# Patient Record
Sex: Female | Born: 1985 | Race: Black or African American | Hispanic: No | Marital: Married | Smoking: Never smoker
Health system: Southern US, Community
[De-identification: ages and names within clinical notes are randomized; demographics above are authoritative.]

## PROBLEM LIST (undated history)

## (undated) DIAGNOSIS — M1990 Unspecified osteoarthritis, unspecified site: Secondary | ICD-10-CM

## (undated) DIAGNOSIS — K219 Gastro-esophageal reflux disease without esophagitis: Secondary | ICD-10-CM

## (undated) DIAGNOSIS — L989 Disorder of the skin and subcutaneous tissue, unspecified: Secondary | ICD-10-CM

## (undated) DIAGNOSIS — D573 Sickle-cell trait: Secondary | ICD-10-CM

## (undated) DIAGNOSIS — R7303 Prediabetes: Secondary | ICD-10-CM

## (undated) DIAGNOSIS — D649 Anemia, unspecified: Secondary | ICD-10-CM

## (undated) DIAGNOSIS — O24419 Gestational diabetes mellitus in pregnancy, unspecified control: Secondary | ICD-10-CM

## (undated) DIAGNOSIS — I10 Essential (primary) hypertension: Secondary | ICD-10-CM

## (undated) DIAGNOSIS — O10013 Pre-existing essential hypertension complicating pregnancy, third trimester: Secondary | ICD-10-CM

## (undated) DIAGNOSIS — Z87442 Personal history of urinary calculi: Secondary | ICD-10-CM

## (undated) HISTORY — PX: OTHER SURGICAL HISTORY: SHX169

## (undated) HISTORY — DX: Anemia, unspecified: D64.9

## (undated) HISTORY — PX: WISDOM TOOTH EXTRACTION: SHX21

---
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
## 2008-12-12 ENCOUNTER — Emergency Department (HOSPITAL_BASED_OUTPATIENT_CLINIC_OR_DEPARTMENT_OTHER): Admission: EM | Admit: 2008-12-12 | Discharge: 2008-12-12 | Payer: Self-pay | Admitting: Emergency Medicine

## 2010-06-27 LAB — CBC
HCT: 38.1 % (ref 36.0–46.0)
MCV: 82.8 fL (ref 78.0–100.0)
RBC: 4.6 MIL/uL (ref 3.87–5.11)
WBC: 7.8 10*3/uL (ref 4.0–10.5)

## 2010-06-27 LAB — BASIC METABOLIC PANEL
BUN: 7 mg/dL (ref 6–23)
Chloride: 106 mEq/L (ref 96–112)
GFR calc Af Amer: >60 mL/min (ref >60)
Potassium: 3.8 mEq/L (ref 3.5–5.1)

## 2010-06-27 LAB — DIFFERENTIAL
Basophils Relative: 0 % (ref 0–1)
Eosinophils Relative: 0 % (ref 0–5)
Lymphs Abs: 1.2 10*3/uL (ref 0.7–4.0)
Monocytes Absolute: 0.5 10*3/uL (ref 0.1–1.0)

## 2010-06-27 LAB — URINALYSIS, ROUTINE W REFLEX MICROSCOPIC
Glucose, UA: NEGATIVE mg/dL
Hgb urine dipstick: NEGATIVE
Ketones, ur: NEGATIVE mg/dL
pH: 5.5 (ref 5.0–8.0)

## 2011-07-09 ENCOUNTER — Emergency Department (HOSPITAL_BASED_OUTPATIENT_CLINIC_OR_DEPARTMENT_OTHER)
Admission: EM | Admit: 2011-07-09 | Discharge: 2011-07-09 | Disposition: A | Discharge status: Home / Self Care | Payer: Medicaid Other | Attending: Emergency Medicine | Admitting: Emergency Medicine

## 2011-07-09 ENCOUNTER — Encounter (HOSPITAL_BASED_OUTPATIENT_CLINIC_OR_DEPARTMENT_OTHER): Payer: Self-pay | Admitting: *Deleted

## 2011-07-09 DIAGNOSIS — I10 Essential (primary) hypertension: Secondary | ICD-10-CM | POA: Insufficient documentation

## 2011-07-09 MED ORDER — HYDROCHLOROTHIAZIDE 25 MG PO TABS: 25.0000 mg | ORAL_TABLET | Freq: Every day | ORAL | Status: DC

## 2011-07-09 NOTE — Discharge Instructions (Signed)
Hypertension Information As your heart beats, it forces blood through your arteries. This force is your blood pressure. If the pressure is too high, it is called hypertension (HTN) or high blood pressure. HTN is dangerous because you may have it and not know it. High blood pressure may mean that your heart has to work harder to pump blood. Your arteries may be narrow or stiff. The extra work puts you at risk for heart disease, stroke, and other problems.  Blood pressure consists of two numbers, a higher number over a lower, 110/72, for example. It is stated as "110 over 72." The ideal is below 120 for the top number (systolic) and under 80 for the bottom (diastolic).  You should pay close attention to your blood pressure if you have certain conditions such as:  Heart failure.   Prior heart attack.   Diabetes   Chronic kidney disease.   Prior stroke.   Multiple risk factors for heart disease.  To see if you have HTN, your blood pressure should be measured while you are seated with your arm held at the level of the heart. It should be measured at least twice. A one-time elevated blood pressure reading (especially in the Emergency Department) does not mean that you need treatment. There may be conditions in which the blood pressure is different between your right and left arms. It is important to see your caregiver soon for a recheck. Most people have essential hypertension which means that there is not a specific cause. This type of high blood pressure may be lowered by changing lifestyle factors such as:  Stress.   Smoking.   Lack of exercise.   Excessive weight.   Drug/tobacco/alcohol use.   Eating less salt.  Most people do not have symptoms from high blood pressure until it has caused damage to the body. Effective treatment can often prevent, delay or reduce that damage. TREATMENT  Treatment for high blood pressure, when a cause has been identified, is directed at the cause. There  are a large number of medications to treat HTN. These fall into several categories, and your caregiver will help you select the medicines that are best for you. Medications may have side effects. You should review side effects with your caregiver. If your blood pressure stays high after you have made lifestyle changes or started on medicines,   Your medication(s) may need to be changed.   Other problems may need to be addressed.   Be certain you understand your prescriptions, and know how and when to take your medicine.   Be sure to follow up with your caregiver within the time frame advised (usually within two weeks) to have your blood pressure rechecked and to review your medications.   If you are taking more than one medicine to lower your blood pressure, make sure you know how and at what times they should be taken. Taking two medicines at the same time can result in blood pressure that is too low.  Document Released: 05/12/2005 Document Revised: 11/19/2010 Document Reviewed: 05/19/2007 Exit[employer] Patient Information 2012 [employer]. 

## 2011-07-09 NOTE — ED Notes (Signed)
Patient states she needs a note saying it is ok for her to start a new job next week.  States she was seen there one week ago and told her bp was elevated and now needs a note saying it is ok for her to start work.

## 2011-07-09 NOTE — ED Provider Notes (Signed)
History     CSN: [id number]  Arrival date & time 07/09/11  1247   First MD Initiated Contact with Patient 07/09/11 1255      Chief Complaint  Patient presents with  . Medical Clearance    needs bp checked    (Consider location/radiation/quality/duration/timing/severity/associated sxs/prior treatment) Patient is a 26 y.o. female presenting with hypertension. The history is provided by the patient. No language interpreter was used.  Hypertension This is a new problem. The current episode started 1 to 4 weeks ago. The problem occurs constantly. The problem has been unchanged. Pertinent negatives include no chest pain. The symptoms are aggravated by nothing. She has tried nothing for the symptoms.  Pt had elevated blood pressure on a job physical.  Pt reports can not see primary MD until July.  Pt tried to go to Urgent care but can't be seen due to medicaid.    Past Medical History  Diagnosis Date  . Diabetes in pregnancy     History reviewed. No pertinent past surgical history.  No family history on file.  History  Substance Use Topics  . Smoking status: Never Smoker   . Smokeless tobacco: Not on file  . Alcohol Use: No    OB History    Grav Para Term Preterm Abortions TAB SAB Ect Mult Living                  Review of Systems  Cardiovascular: Negative for chest pain and leg swelling.  All other systems reviewed and are negative.    Allergies  Review of patient's allergies indicates no known allergies.  Home Medications  No current outpatient prescriptions on file.  BP 148/92  Pulse 88  Temp(Src) 98.9 F (37.2 C) (Oral)  Resp 20  Ht 5\' 4"  (1.626 m)  Wt 220 lb (99.791 kg)  BMI 37.76 kg/m2  SpO2 99%  LMP 06/23/2011  Physical Exam  Vitals reviewed. Constitutional: She is oriented to person, place, and time. She appears well-developed and well-nourished.  HENT:  Head: Normocephalic and atraumatic.  Eyes: Conjunctivae and EOM are normal. Pupils are  equal, round, and reactive to light.  Neck: Normal range of motion. Neck supple.  Cardiovascular: Normal rate and regular rhythm.   Pulmonary/Chest: Effort normal and breath sounds normal.  Abdominal: Soft. Bowel sounds are normal.  Musculoskeletal: Normal range of motion.  Neurological: She is alert and oriented to person, place, and time. She has normal reflexes.  Skin: Skin is warm.  Psychiatric: She has a normal mood and affect.    ED Course  Procedures (including critical care time)  Labs Reviewed - No data to display No results found.   No diagnosis found.    MDM  Pt given rx for hctz.          Lonia Skinner [place], [place] 07/09/11 [phone]

## 2011-07-10 NOTE — ED Provider Notes (Signed)
Medical screening examination/treatment/procedure(s) were performed by non-physician practitioner and as supervising physician I was immediately available for consultation/collaboration.   Joya Gaskins, MD 07/10/11 [phone]

## 2011-11-14 ENCOUNTER — Emergency Department (HOSPITAL_BASED_OUTPATIENT_CLINIC_OR_DEPARTMENT_OTHER)
Admission: EM | Admit: 2011-11-14 | Discharge: 2011-11-14 | Disposition: A | Discharge status: Home / Self Care | Payer: Self-pay | Attending: Emergency Medicine | Admitting: Emergency Medicine

## 2011-11-14 ENCOUNTER — Encounter (HOSPITAL_BASED_OUTPATIENT_CLINIC_OR_DEPARTMENT_OTHER): Payer: Self-pay | Admitting: Emergency Medicine

## 2011-11-14 DIAGNOSIS — A499 Bacterial infection, unspecified: Secondary | ICD-10-CM | POA: Insufficient documentation

## 2011-11-14 DIAGNOSIS — R102 Pelvic and perineal pain: Secondary | ICD-10-CM

## 2011-11-14 DIAGNOSIS — N760 Acute vaginitis: Secondary | ICD-10-CM | POA: Insufficient documentation

## 2011-11-14 DIAGNOSIS — B9689 Other specified bacterial agents as the cause of diseases classified elsewhere: Secondary | ICD-10-CM | POA: Insufficient documentation

## 2011-11-14 DIAGNOSIS — N949 Unspecified condition associated with female genital organs and menstrual cycle: Secondary | ICD-10-CM | POA: Insufficient documentation

## 2011-11-14 HISTORY — DX: Essential (primary) hypertension: I10

## 2011-11-14 LAB — WET PREP, GENITAL: Yeast Wet Prep HPF POC: NONE SEEN

## 2011-11-14 LAB — PREGNANCY, URINE: Preg Test, Ur: NEGATIVE

## 2011-11-14 LAB — URINALYSIS, ROUTINE W REFLEX MICROSCOPIC
Glucose, UA: NEGATIVE mg/dL
Hgb urine dipstick: NEGATIVE
Specific Gravity, Urine: 1.013 (ref 1.005–1.030)

## 2011-11-14 MED ORDER — AZITHROMYCIN 250 MG PO TABS
1000.0000 mg | ORAL_TABLET | Freq: Once | ORAL | Status: AC
  Administered 2011-11-14: 1000 mg via ORAL
  Filled 2011-11-14: qty 4

## 2011-11-14 MED ORDER — LIDOCAINE HCL (PF) 1 % IJ SOLN
INTRAMUSCULAR | Status: AC
  Filled 2011-11-14: qty 5

## 2011-11-14 MED ORDER — METRONIDAZOLE 500 MG PO TABS: 500.0000 mg | ORAL_TABLET | Freq: Two times a day (BID) | ORAL | Status: AC

## 2011-11-14 MED ORDER — CEFTRIAXONE SODIUM 250 MG IJ SOLR
250.0000 mg | Freq: Once | INTRAMUSCULAR | Status: AC
  Administered 2011-11-14: 250 mg via INTRAMUSCULAR
  Filled 2011-11-14: qty 250

## 2011-11-14 NOTE — ED Provider Notes (Addendum)
History     CSN: [id number]  Arrival date & time 11/14/11  1137   First MD Initiated Contact with Patient 11/14/11 1141      Chief Complaint  Patient presents with  . Flank Pain  . Back Pain    (Consider location/radiation/quality/duration/timing/severity/associated sxs/prior treatment) Patient is a 26 y.o. female presenting with flank pain and abdominal pain. The history is provided by the patient.  Flank Pain This is a new problem. Episode onset: 3 days ago. The problem occurs constantly. The problem has been gradually worsening. Associated symptoms include abdominal pain. Associated symptoms comments: Suprapubic pain and right flank pain. The symptoms are aggravated by bending (Certain position). Nothing relieves the symptoms. She has tried acetaminophen for the symptoms. The treatment provided no relief.  Abdominal Pain The primary symptoms of the illness include abdominal pain and nausea. The primary symptoms of the illness do not include fever, fatigue, vomiting, diarrhea or vaginal discharge. Episode onset: 3 days. The onset of the illness was gradual. The problem has been gradually worsening.  The abdominal pain is located in the suprapubic region. The abdominal pain radiates to the right flank. The severity of the abdominal pain is 7/10.  The patient states that she believes she is currently not pregnant. Additional symptoms associated with the illness include urgency, frequency and back pain. Symptoms associated with the illness do not include chills, anorexia or hematuria.    Past Medical History  Diagnosis Date  . Diabetes in pregnancy     No past surgical history on file.  No family history on file.  History  Substance Use Topics  . Smoking status: Never Smoker   . Smokeless tobacco: Not on file  . Alcohol Use: No    OB History    Grav Para Term Preterm Abortions TAB SAB Ect Mult Living                  Review of Systems  Constitutional: Negative for  fever, chills and fatigue.  Gastrointestinal: Positive for nausea and abdominal pain. Negative for vomiting, diarrhea and anorexia.  Genitourinary: Positive for urgency, frequency and flank pain. Negative for hematuria and vaginal discharge.  Musculoskeletal: Positive for back pain.  All other systems reviewed and are negative.    Allergies  Review of patient's allergies indicates no known allergies.  Home Medications   Current Outpatient Rx  Name Route Sig Dispense Refill  . HYDROCHLOROTHIAZIDE 25 MG PO TABS Oral Take 1 tablet (25 mg total) by mouth daily. 30 tablet 3    BP 142/96  Pulse 82  Temp 98.4 F (36.9 C) (Oral)  Resp 18  Ht 5\' 4"  (1.626 m)  Wt 205 lb (92.987 kg)  BMI 35.19 kg/m2  SpO2 100%  Physical Exam  Nursing note and vitals reviewed. Constitutional: She is oriented to person, place, and time. She appears well-developed and well-nourished. No distress.  HENT:  Head: Normocephalic and atraumatic.  Mouth/Throat: Oropharynx is clear and moist.  Eyes: Conjunctivae and EOM are normal. Pupils are equal, round, and reactive to light.  Neck: Normal range of motion. Neck supple.  Cardiovascular: Normal rate, regular rhythm and intact distal pulses.   No murmur heard. Pulmonary/Chest: Effort normal and breath sounds normal. No respiratory distress. She has no wheezes. She has no rales.  Abdominal: Soft. Normal appearance. She exhibits no distension. There is tenderness in the suprapubic area. There is CVA tenderness. There is no rebound and no guarding.       Right CVA  tenderness  Genitourinary: Uterus normal. Cervix exhibits discharge. Cervix exhibits no motion tenderness and no friability. Right adnexum displays no tenderness. Left adnexum displays no tenderness. Vaginal discharge found.  Musculoskeletal: Normal range of motion. She exhibits no edema and no tenderness.  Neurological: She is alert and oriented to person, place, and time.  Skin: Skin is warm and dry.  No rash noted. No erythema.  Psychiatric: She has a normal mood and affect. Her behavior is normal.    ED Course  Procedures (including critical care time)  Labs Reviewed  WET PREP, GENITAL - Abnormal; Notable for the following:    Clue Cells Wet Prep HPF POC TOO NUMEROUS TO COUNT (*)     WBC, Wet Prep HPF POC MODERATE (*)     All other components within normal limits  URINALYSIS, ROUTINE W REFLEX MICROSCOPIC  PREGNANCY, URINE  GC/CHLAMYDIA PROBE AMP, GENITAL   No results found.   1. Bacterial vaginosis   2. Pelvic pain in female       MDM   Patient with suprapubic and right flank pain that started 3 days ago and is not improving. She denies any vaginal discharge or dysuria but states she's had pyelonephritis in the past that presented like this. Nausea but no vomiting. No change in menses. Currently patient is taking no medications.  Concern for urine versus vaginal pathology. UA, UPT, wet prep, GC Chlamydia pending.  12:33 PM Getting too numerous to count clue cells will treat for bacterial vaginosis. Secondly do to moderate white blood cells the patient being sexually active and having unprotected sex will cover for possible STDs. Rocephin and azithromycin given. Prescription for Flagyl also given      Gwyneth Sprout, MD 11/14/11 1234  Gwyneth Sprout, MD 11/14/11 1238

## 2011-11-14 NOTE — ED Notes (Signed)
Patient presents with right sided lower flank and back pain.  She reports urinary frequency, no discharge. Symptoms started on Thursday after her menstrual cycle stopped.  Hx. Of kidney infection. Wants to be tested for STDs.

## 2012-05-11 ENCOUNTER — Other Ambulatory Visit: Payer: Self-pay | Admitting: Family Medicine

## 2012-05-11 ENCOUNTER — Other Ambulatory Visit (HOSPITAL_COMMUNITY)
Admission: RE | Admit: 2012-05-11 | Discharge: 2012-05-11 | Disposition: A | Discharge status: Home / Self Care | Payer: 59 | Source: Ambulatory Visit | Attending: Family Medicine | Admitting: Family Medicine

## 2012-05-11 DIAGNOSIS — Z0000 Encounter for general adult medical examination without abnormal findings: Secondary | ICD-10-CM | POA: Insufficient documentation

## 2012-05-11 DIAGNOSIS — Z113 Encounter for screening for infections with a predominantly sexual mode of transmission: Secondary | ICD-10-CM | POA: Insufficient documentation

## 2012-10-24 ENCOUNTER — Encounter (HOSPITAL_COMMUNITY): Payer: Self-pay | Admitting: *Deleted

## 2012-10-24 ENCOUNTER — Inpatient Hospital Stay (HOSPITAL_COMMUNITY)
Admission: AD | Admit: 2012-10-24 | Discharge: 2012-10-24 | Disposition: A | Discharge status: Home / Self Care | Payer: 59 | Source: Ambulatory Visit | Attending: Obstetrics and Gynecology | Admitting: Obstetrics and Gynecology

## 2012-10-24 DIAGNOSIS — I10 Essential (primary) hypertension: Secondary | ICD-10-CM | POA: Insufficient documentation

## 2012-10-24 DIAGNOSIS — L02219 Cutaneous abscess of trunk, unspecified: Secondary | ICD-10-CM | POA: Insufficient documentation

## 2012-10-24 DIAGNOSIS — L0291 Cutaneous abscess, unspecified: Secondary | ICD-10-CM

## 2012-10-24 DIAGNOSIS — L0390 Cellulitis, unspecified: Secondary | ICD-10-CM

## 2012-10-24 HISTORY — DX: Gestational diabetes mellitus in pregnancy, unspecified control: O24.419

## 2012-10-24 LAB — URINALYSIS, ROUTINE W REFLEX MICROSCOPIC
Glucose, UA: NEGATIVE mg/dL
Ketones, ur: NEGATIVE mg/dL
Leukocytes, UA: NEGATIVE
pH: 6 (ref 5.0–8.0)

## 2012-10-24 MED ORDER — CEPHALEXIN 500 MG PO CAPS: 500.0000 mg | ORAL_CAPSULE | Freq: Four times a day (QID) | ORAL | Status: DC

## 2012-10-24 NOTE — MAU Provider Note (Signed)
History     CSN: [id number]  Arrival date and time: 10/24/12 1308   First Provider Initiated Contact with Patient 10/24/12 2201      No chief complaint on file.  HPI Ms. Madeline Cooke is a 27 y.o. [phone] who presents to MAU today with complaint of a "bump" on her mons pubis. The patient states that she first noticed the bump forming on Saturday. Discomfort started yesterday. She has been taking warm showers and baths and noted drainage spontaneously today. She does shave the area regularly. She denies fever. Rates her pain at 2/10 now. Takes Ibuprofen or Aleve for pain.   OB History   Grav Para Term Preterm Abortions TAB SAB Ect Mult Living   5 4   1 1    4       Past Medical History  Diagnosis Date  . Hypertension   . Diabetes mellitus   . Gestational diabetes     Past Surgical History  Procedure Laterality Date  . Mole removed      Family History  Problem Relation Age of Onset  . Hypertension Maternal Grandfather   . Diabetes Maternal Grandfather   . Hypertension Paternal Grandmother   . Diabetes Paternal Grandmother     History  Substance Use Topics  . Smoking status: Never Smoker   . Smokeless tobacco: Not on file  . Alcohol Use: Yes     Comment: occasionally    Allergies: No Known Allergies  No prescriptions prior to admission    Review of Systems  Constitutional: Negative for fever and malaise/fatigue.  Genitourinary:       Draining abscess on the mons pubis   Physical Exam   Blood pressure 162/110, pulse 85, temperature 98.2 F (36.8 C), temperature source Oral, resp. rate 16, height 5\' 3"  (1.6 m), weight 237 lb (107.502 kg), last menstrual period 10/10/2012.  Physical Exam  Constitutional: She is oriented to person, place, and time. She appears well-developed and well-nourished. No distress.  HENT:  Head: Normocephalic and atraumatic.  Cardiovascular: Normal rate.   Respiratory: Effort normal.  GI: Soft.  Genitourinary:  ~ 2 cm area of  swelling with minimal active drainage and minimal surrounding erythema or edema. Able to express a small amount of purulent drainage with light pressure.   Neurological: She is alert and oriented to person, place, and time.  Skin: Skin is warm and dry. No erythema.  Psychiatric: She has a normal mood and affect.   Results for orders placed during the hospital encounter of 10/24/12 (from the past 24 hour(s))  URINALYSIS, ROUTINE W REFLEX MICROSCOPIC     Status: None   Collection Time    10/24/12  7:50 PM      Result Value Range   Color, Urine YELLOW  YELLOW   APPearance CLEAR  CLEAR   Specific Gravity, Urine 1.015  1.005 - 1.030   pH 6.0  5.0 - 8.0   Glucose, UA NEGATIVE  NEGATIVE mg/dL   Hgb urine dipstick NEGATIVE  NEGATIVE   Bilirubin Urine NEGATIVE  NEGATIVE   Ketones, ur NEGATIVE  NEGATIVE mg/dL   Protein, ur NEGATIVE  NEGATIVE mg/dL   Urobilinogen, UA 0.2  0.0 - 1.0 mg/dL   Nitrite NEGATIVE  NEGATIVE   Leukocytes, UA NEGATIVE  NEGATIVE  POCT PREGNANCY, URINE     Status: None   Collection Time    10/24/12  7:56 PM      Result Value Range   Preg Test, Ur NEGATIVE  NEGATIVE  MAU Course  Procedures None  MDM UPT - negative UA today Patient declines STD testing today, has appointment to establish care for G[facility] with [facility] at the end of the month  Assessment and Plan  A: Abscess of the mons pubis, draining spontaneously HTN  P: Discharge home Rx for Keflex given to patient Patient advised to continue warm baths, showers and compresses for continued drainage Patient encouraged to keep appointment as scheduled to establish care with [facility] this month Patient advised to call PCP in the morning to discuss blood pressures. CVA warning signs reviewed Patient may return to MAU as needed or if her condition were to change or worsen  Freddi Starr, PA-C  10/24/2012, 10:27 PM

## 2012-10-24 NOTE — MAU Note (Signed)
PT SAYS  SHE WASN'T TO BE TESTED  FOR STD'S.   HAS APPOINTMENT  FOR 8-26- WITH  [facility].     SHE HAD  A BUMP IN PUBIC HAIR  LAST WEEK - IT WENT AWAY ON WED- DRIED,  THEN TODAY IT CAME BACK AND IS AN OPEN SORE-  DRAINING-  RED .  NO VAG D/C.  HAS ABD PAIN- STARTED YESTERDAY-  DID NOT TAKE ANY MED-  WENT AWAY-  NO CRAMPS NOW.    LAST SEX- SAT- NO PROTECTION.-  SAME PARTNER  X1 YEAR.

## 2012-10-24 NOTE — MAU Note (Signed)
Pt states she is scared about the bump on her mons pubis that came about  this past Saturday. Says The hot shower helped it to drain, has an appt on the 26th of Aug but did not want to wait that long.

## 2012-10-26 NOTE — MAU Provider Note (Signed)
Attestation of Attending Supervision of Advanced Practitioner (CNM/NP): Evaluation and management procedures were performed by the Advanced Practitioner under my supervision and collaboration.  I have reviewed the Advanced Practitioner's note and chart, and I agree with the management and plan.  Lea Walbert 10/26/2012 10:58 AM

## 2012-11-15 ENCOUNTER — Other Ambulatory Visit (HOSPITAL_COMMUNITY)
Admission: RE | Admit: 2012-11-15 | Discharge: 2012-11-15 | Disposition: A | Discharge status: Home / Self Care | Payer: 59 | Source: Ambulatory Visit | Attending: Obstetrics and Gynecology | Admitting: Obstetrics and Gynecology

## 2012-11-15 ENCOUNTER — Other Ambulatory Visit: Payer: Self-pay | Admitting: Obstetrics and Gynecology

## 2012-11-15 ENCOUNTER — Other Ambulatory Visit: Payer: Self-pay | Admitting: Nurse Practitioner

## 2012-11-15 DIAGNOSIS — N6320 Unspecified lump in the left breast, unspecified quadrant: Secondary | ICD-10-CM

## 2012-11-15 DIAGNOSIS — Z113 Encounter for screening for infections with a predominantly sexual mode of transmission: Secondary | ICD-10-CM | POA: Insufficient documentation

## 2012-11-15 DIAGNOSIS — N760 Acute vaginitis: Secondary | ICD-10-CM | POA: Insufficient documentation

## 2012-11-18 ENCOUNTER — Other Ambulatory Visit: Payer: 59

## 2012-12-19 ENCOUNTER — Ambulatory Visit
Admission: RE | Admit: 2012-12-19 | Discharge: 2012-12-19 | Disposition: A | Discharge status: Home / Self Care | Payer: 59 | Source: Ambulatory Visit | Attending: Obstetrics and Gynecology | Admitting: Obstetrics and Gynecology

## 2012-12-19 DIAGNOSIS — N6320 Unspecified lump in the left breast, unspecified quadrant: Secondary | ICD-10-CM

## 2013-03-29 ENCOUNTER — Emergency Department (HOSPITAL_BASED_OUTPATIENT_CLINIC_OR_DEPARTMENT_OTHER)
Admission: EM | Admit: 2013-03-29 | Discharge: 2013-03-29 | Disposition: A | Discharge status: Home / Self Care | Payer: 59 | Attending: Emergency Medicine | Admitting: Emergency Medicine

## 2013-03-29 ENCOUNTER — Encounter (HOSPITAL_BASED_OUTPATIENT_CLINIC_OR_DEPARTMENT_OTHER): Payer: Self-pay | Admitting: Emergency Medicine

## 2013-03-29 DIAGNOSIS — E119 Type 2 diabetes mellitus without complications: Secondary | ICD-10-CM | POA: Insufficient documentation

## 2013-03-29 DIAGNOSIS — Z792 Long term (current) use of antibiotics: Secondary | ICD-10-CM | POA: Insufficient documentation

## 2013-03-29 DIAGNOSIS — I10 Essential (primary) hypertension: Secondary | ICD-10-CM | POA: Insufficient documentation

## 2013-03-29 DIAGNOSIS — B9689 Other specified bacterial agents as the cause of diseases classified elsewhere: Secondary | ICD-10-CM | POA: Insufficient documentation

## 2013-03-29 DIAGNOSIS — Z79899 Other long term (current) drug therapy: Secondary | ICD-10-CM | POA: Insufficient documentation

## 2013-03-29 DIAGNOSIS — N760 Acute vaginitis: Secondary | ICD-10-CM | POA: Insufficient documentation

## 2013-03-29 DIAGNOSIS — Z3202 Encounter for pregnancy test, result negative: Secondary | ICD-10-CM | POA: Insufficient documentation

## 2013-03-29 DIAGNOSIS — A499 Bacterial infection, unspecified: Secondary | ICD-10-CM | POA: Insufficient documentation

## 2013-03-29 LAB — URINALYSIS, ROUTINE W REFLEX MICROSCOPIC
BILIRUBIN URINE: NEGATIVE
Glucose, UA: NEGATIVE mg/dL
Ketones, ur: NEGATIVE mg/dL
Leukocytes, UA: NEGATIVE
Nitrite: NEGATIVE
PROTEIN: NEGATIVE mg/dL
Specific Gravity, Urine: 1.014 (ref 1.005–1.030)
UROBILINOGEN UA: 0.2 mg/dL (ref 0.0–1.0)
pH: 7 (ref 5.0–8.0)

## 2013-03-29 LAB — URINE MICROSCOPIC-ADD ON

## 2013-03-29 LAB — WET PREP, GENITAL
Trich, Wet Prep: NONE SEEN
Yeast Wet Prep HPF POC: NONE SEEN

## 2013-03-29 LAB — PREGNANCY, URINE: PREG TEST UR: NEGATIVE

## 2013-03-29 MED ORDER — METRONIDAZOLE 500 MG PO TABS: 500.0000 mg | ORAL_TABLET | Freq: Two times a day (BID) | ORAL | Status: DC

## 2013-03-29 NOTE — ED Notes (Signed)
C/o foul smelling vaginal d/c and abd pain since 03/25/13-has appt with GYN to remove IUD next week

## 2013-03-29 NOTE — Discharge Instructions (Signed)
Bacterial Vaginosis Bacterial vaginosis (BV) is a vaginal infection where the normal balance of bacteria in the vagina is disrupted. The normal balance is then replaced by an overgrowth of certain bacteria. There are several different kinds of bacteria that can cause BV. BV is the most common vaginal infection in women of childbearing age. CAUSES   The cause of BV is not fully understood. BV develops when there is an increase or imbalance of harmful bacteria.  Some activities or behaviors can upset the normal balance of bacteria in the vagina and put women at increased risk including:  Having a new sex partner or multiple sex partners.  Douching.  Using an intrauterine device (IUD) for contraception.  It is not clear what role sexual activity plays in the development of BV. However, women that have never had sexual intercourse are rarely infected with BV. Women do not get BV from toilet seats, bedding, swimming pools or from touching objects around them.  SYMPTOMS   Grey vaginal discharge.  A fish-like odor with discharge, especially after sexual intercourse.  Itching or burning of the vagina and vulva.  Burning or pain with urination.  Some women have no signs or symptoms at all. DIAGNOSIS  Your caregiver must examine the vagina for signs of BV. Your caregiver will perform lab tests and look at the sample of vaginal fluid through a microscope. They will look for bacteria and abnormal cells (clue cells), a pH test higher than 4.5, and a positive amine test all associated with BV.  RISKS AND COMPLICATIONS   Pelvic inflammatory disease (PID).  Infections following gynecology surgery.  Developing HIV.  Developing herpes virus. TREATMENT  Sometimes BV will clear up without treatment. However, all women with symptoms of BV should be treated to avoid complications, especially if gynecology surgery is planned. Female partners generally do not need to be treated. However, BV may spread  between female sex partners so treatment is helpful in preventing a recurrence of BV.   BV may be treated with antibiotics. The antibiotics come in either pill or vaginal cream forms. Either can be used with nonpregnant or pregnant women, but the recommended dosages differ. These antibiotics are not harmful to the baby.  BV can recur after treatment. If this happens, a second round of antibiotics will often be prescribed.  Treatment is important for pregnant women. If not treated, BV can cause a premature delivery, especially for a pregnant woman who had a premature birth in the past. All pregnant women who have symptoms of BV should be checked and treated.  For chronic reoccurrence of BV, treatment with a type of prescribed gel vaginally twice a week is helpful. HOME CARE INSTRUCTIONS   Finish all medication as directed by your caregiver.  Do not have sex until treatment is completed.  Tell your sexual partner that you have a vaginal infection. They should see their caregiver and be treated if they have problems, such as a mild rash or itching.  Practice safe sex. Use condoms. Only have 1 sex partner. PREVENTION  Basic prevention steps can help reduce the risk of upsetting the natural balance of bacteria in the vagina and developing BV:  Do not have sexual intercourse (be abstinent).  Do not douche.  Use all of the medicine prescribed for treatment of BV, even if the signs and symptoms go away.  Tell your sex partner if you have BV. That way, they can be treated, if needed, to prevent reoccurrence. SEEK MEDICAL CARE IF:     Your symptoms are not improving after 3 days of treatment.  You have increased discharge, pain, or fever. MAKE SURE YOU:   Understand these instructions.  Will watch your condition.  Will get help right away if you are not doing well or get worse. FOR MORE INFORMATION  Division of STD Prevention (DSTDP), Centers for Disease Control and Prevention:  www.cdc.gov/std [employer] Association (ASHA): www.ashastd.org  Document Released: 03/09/2005 Document Revised: 06/01/2011 Document Reviewed: 10/19/2012 [employer] Patient Information 2014 [employer].  

## 2013-03-29 NOTE — ED Provider Notes (Signed)
CSN: [id number]     Arrival date & time 03/29/13  1649 History   First MD Initiated Contact with Patient 03/29/13 1744     Chief Complaint  Patient presents with  . Vaginal Discharge   (Consider location/radiation/quality/duration/timing/severity/associated sxs/prior Treatment) Patient is a 28 y.o. female presenting with vaginal discharge. The history is provided by the patient.  Vaginal Discharge Quality:  Malodorous, watery, yellow and thick Severity:  Severe Onset quality:  Gradual Duration:  3 days Timing:  Constant Progression:  Worsening Chronicity:  Recurrent Context: spontaneously   Relieved by:  Nothing Worsened by:  Nothing tried Ineffective treatments:  OTC medications Associated symptoms: abdominal pain   Associated symptoms: no dysuria, no fever, no genital lesions, no nausea, no rash, no urinary frequency, no urinary incontinence and no vaginal itching   Associated symptoms comment:  Mild pelvic pain occassionally Risk factors: unprotected sex   Risk factors: no new sexual partner, no PID and no STI exposure     Past Medical History  Diagnosis Date  . Hypertension   . Diabetes mellitus   . Gestational diabetes    Past Surgical History  Procedure Laterality Date  . Mole removed     Family History  Problem Relation Age of Onset  . Hypertension Maternal Grandfather   . Diabetes Maternal Grandfather   . Hypertension Paternal Grandmother   . Diabetes Paternal Grandmother    History  Substance Use Topics  . Smoking status: Never Smoker   . Smokeless tobacco: Not on file  . Alcohol Use: Yes     Comment: occasionally   OB History   Grav Para Term Preterm Abortions TAB SAB Ect Mult Living   5 4   1 1    4      Review of Systems  Constitutional: Negative for fever.  Gastrointestinal: Positive for abdominal pain. Negative for nausea.  Genitourinary: Positive for vaginal discharge. Negative for bladder incontinence and dysuria.  All other systems reviewed  and are negative.    Allergies  Review of patient's allergies indicates no known allergies.  Home Medications   Current Outpatient Rx  Name  Route  Sig  Dispense  Refill  . Carbonyl Iron (PERFECT IRON PO)   Oral   Take 2 tablets by mouth daily.         . cephALEXin (KEFLEX) 500 MG capsule   Oral   Take 1 capsule (500 mg total) by mouth 4 (four) times daily.   20 capsule   0   . Cholecalciferol (VITAMIN D-400 PO)   Oral   Take 400 mg by mouth daily.         . metroNIDAZOLE (FLAGYL) 500 MG tablet   Oral   Take 1 tablet (500 mg total) by mouth 2 (two) times daily.   14 tablet   0   . Naproxen Sodium (ALEVE PO)   Oral   Take 2 tablets by mouth daily as needed (heacache).          BP 154/93  Pulse 74  Temp(Src) 98.5 F (36.9 C) (Oral)  Resp 16  Ht 5\' 3"  (1.6 m)  Wt 240 lb (108.863 kg)  BMI 42.52 kg/m2  SpO2 100%  LMP 03/16/2013 Physical Exam  Nursing note and vitals reviewed. Constitutional: She is oriented to person, place, and time. She appears well-developed and well-nourished. No distress.  HENT:  Head: Normocephalic and atraumatic.  Mouth/Throat: Oropharynx is clear and moist.  Eyes: Conjunctivae and EOM are normal. Pupils are equal, round,  and reactive to light.  Neck: Normal range of motion. Neck supple.  Cardiovascular: Normal rate, regular rhythm and intact distal pulses.   No murmur heard. Pulmonary/Chest: Effort normal and breath sounds normal. No respiratory distress. She has no wheezes. She has no rales.  Abdominal: Soft. She exhibits no distension. There is no tenderness. There is no rebound and no guarding.  Genitourinary: Uterus normal. Cervix exhibits discharge. Cervix exhibits no motion tenderness and no friability. Right adnexum displays no mass, no tenderness and no fullness. Left adnexum displays no mass, no tenderness and no fullness. Vaginal discharge found.  copious discharge that is thin and white  Musculoskeletal: Normal range of  motion. She exhibits no edema and no tenderness.  Neurological: She is alert and oriented to person, place, and time.  Skin: Skin is warm and dry. No rash noted. No erythema.  Psychiatric: She has a normal mood and affect. Her behavior is normal.    ED Course  Procedures (including critical care time) Labs Review Labs Reviewed  WET PREP, GENITAL - Abnormal; Notable for the following:    Clue Cells Wet Prep HPF POC MODERATE (*)    WBC, Wet Prep HPF POC FEW (*)    All other components within normal limits  URINALYSIS, ROUTINE W REFLEX MICROSCOPIC - Abnormal; Notable for the following:    Hgb urine dipstick MODERATE (*)    All other components within normal limits  URINE MICROSCOPIC-ADD ON - Abnormal; Notable for the following:    Bacteria, UA MANY (*)    All other components within normal limits  GC/CHLAMYDIA PROBE AMP  PREGNANCY, URINE   Imaging Review No results found.  EKG Interpretation   None       MDM   1. Bacterial vaginosis     Pt with intense vag discharge and odor that started today with intermittent pelvic pain and no new sexual partners or high risk activity for STD.  She request removal of her IUD which was done.  No signs of PID on exam and mild bleeding in vaginal vault.  Pt has neg UA and no urinary sx.  Wet prep with BV which she has prior.  Pt given flagyl.  D/ced home to f/u with gyn for further birth control.    Gwyneth SproutWhitney Nikcole Eischeid, MD 03/29/13 [phone]

## 2013-03-30 LAB — GC/CHLAMYDIA PROBE AMP
CT PROBE, AMP APTIMA: NEGATIVE
GC PROBE AMP APTIMA: NEGATIVE

## 2013-05-07 ENCOUNTER — Encounter (HOSPITAL_BASED_OUTPATIENT_CLINIC_OR_DEPARTMENT_OTHER): Payer: Self-pay | Admitting: Emergency Medicine

## 2013-05-07 ENCOUNTER — Emergency Department (HOSPITAL_BASED_OUTPATIENT_CLINIC_OR_DEPARTMENT_OTHER)
Admission: EM | Admit: 2013-05-07 | Discharge: 2013-05-07 | Disposition: A | Discharge status: Home / Self Care | Payer: 59 | Attending: Emergency Medicine | Admitting: Emergency Medicine

## 2013-05-07 DIAGNOSIS — R29898 Other symptoms and signs involving the musculoskeletal system: Secondary | ICD-10-CM

## 2013-05-07 DIAGNOSIS — M2669 Other specified disorders of temporomandibular joint: Secondary | ICD-10-CM | POA: Insufficient documentation

## 2013-05-07 DIAGNOSIS — I10 Essential (primary) hypertension: Secondary | ICD-10-CM | POA: Insufficient documentation

## 2013-05-07 DIAGNOSIS — E119 Type 2 diabetes mellitus without complications: Secondary | ICD-10-CM | POA: Insufficient documentation

## 2013-05-07 DIAGNOSIS — Z792 Long term (current) use of antibiotics: Secondary | ICD-10-CM | POA: Insufficient documentation

## 2013-05-07 DIAGNOSIS — Z79899 Other long term (current) drug therapy: Secondary | ICD-10-CM | POA: Insufficient documentation

## 2013-05-07 MED ORDER — HYDROCODONE-ACETAMINOPHEN 5-325 MG PO TABS: 1.0000 | ORAL_TABLET | Freq: Four times a day (QID) | ORAL | Status: DC | PRN

## 2013-05-07 MED ORDER — NAPROXEN 500 MG PO TABS: 500.0000 mg | ORAL_TABLET | Freq: Two times a day (BID) | ORAL | Status: DC

## 2013-05-07 NOTE — ED Provider Notes (Signed)
CSN: [id number]     Arrival date & time 05/07/13  1727 History   First MD Initiated Contact with Patient 05/07/13 2025     Chief Complaint  Patient presents with  . Otalgia     (Consider location/radiation/quality/duration/timing/severity/associated sxs/prior Treatment) Patient is a 28 y.o. female presenting with ear pain. The history is provided by the patient.  Otalgia Location:  Bilateral Severity:  Moderate Onset quality:  Gradual Duration:  1 week Timing:  Constant Progression:  Worsening  Madeline Cooke is a [age] y.o. female who presents to the ED with bilateral ear pain that has been going on for about a week. The pain increases with eating and opening her mouth. She is getting ready to  Have dental work done and some fillings replaced but didn't think the pain was from her teeth. She denies fever or chills, no sore throat, nausea or vomiting or any other problems.   Past Medical History  Diagnosis Date  . Diabetes mellitus   . Gestational diabetes   . Hypertension    Past Surgical History  Procedure Laterality Date  . Mole removed     Family History  Problem Relation Age of Onset  . Hypertension Maternal Grandfather   . Diabetes Maternal Grandfather   . Hypertension Paternal Grandmother   . Diabetes Paternal Grandmother    History  Substance Use Topics  . Smoking status: Never Smoker   . Smokeless tobacco: Never Used  . Alcohol Use: Yes     Comment: occasionally   OB History   Grav Para Term Preterm Abortions TAB SAB Ect Mult Living   5 4   1 1    4      Review of Systems  Negative except as stated in HPI     Allergies  Review of patient's allergies indicates no known allergies.  Home Medications   Current Outpatient Rx  Name  Route  Sig  Dispense  Refill  . amLODipine (NORVASC) 5 MG tablet   Oral   Take 5 mg by mouth daily.         Marland Kitchen. Carbonyl Iron (PERFECT IRON PO)   Oral   Take 2 tablets by mouth daily.         . cephALEXin (KEFLEX)  500 MG capsule   Oral   Take 1 capsule (500 mg total) by mouth 4 (four) times daily.   20 capsule   0   . Cholecalciferol (VITAMIN D-400 PO)   Oral   Take 400 mg by mouth daily.         . metroNIDAZOLE (FLAGYL) 500 MG tablet   Oral   Take 1 tablet (500 mg total) by mouth 2 (two) times daily.   14 tablet   0   . Naproxen Sodium (ALEVE PO)   Oral   Take 2 tablets by mouth daily as needed (heacache).          BP 164/100  Pulse 89  Temp(Src) 98.7 F (37.1 C) (Oral)  Resp 18  Ht 5\' 3"  (1.6 m)  Wt 210 lb (95.255 kg)  BMI 37.21 kg/m2  SpO2 100%  LMP 04/23/2013 Physical Exam  Nursing note and vitals reviewed. Constitutional: She is oriented to person, place, and time. She appears well-developed and well-nourished.  HENT:  Head: Atraumatic.    Right Ear: Tympanic membrane normal.  Left Ear: Tympanic membrane normal.  Mouth/Throat: Uvula is midline, oropharynx is clear and moist and mucous membranes are normal.  Tenderness with pressing over the  TMJ. There is a click when the patient opens her mouth wide and there is pain that radiates into her ears.   Eyes: EOM are normal.  Neck: Neck supple.  Cardiovascular: Normal rate and regular rhythm.   Pulmonary/Chest: Effort normal. She has no wheezes. She has no rales.  Abdominal: Soft. There is no tenderness.  Musculoskeletal: Normal range of motion.  Lymphadenopathy:    She has no cervical adenopathy.  Neurological: She is alert and oriented to person, place, and time. No cranial nerve deficit.  Skin: Skin is warm and dry.  Psychiatric: She has a normal mood and affect. Her behavior is normal.    ED Course  Procedures (  MDM  28 y.o. female with bilateral ear pain and TMJ click. Will treat with NSAIDS. Discussed with the patient need for follow up with dentist as scheduled and she will ask about dental guard and TMJ pain. She will return here as needed.    [address] [place], [place] 05/08/13 [phone]

## 2013-05-07 NOTE — Discharge Instructions (Signed)
Keep your appointment with your dentist and ask him about your TMJ. Take the medication for pain and inflammation as needed. Do not drive if you are taking the narcotic as it will make you sleepy. Be sure your friend goes with you so he can make your diagnosis and tell you what to do. LOL

## 2013-05-07 NOTE — ED Notes (Signed)
Bilateral ear pain x 1 week.

## 2013-05-09 NOTE — ED Provider Notes (Signed)
Medical screening examination/treatment/procedure(s) were performed by non-physician practitioner and as supervising physician I was immediately available for consultation/collaboration.  EKG Interpretation   None          Shelda JakesScott W. Caitland Porchia, MD 05/09/13 0730

## 2013-07-05 ENCOUNTER — Other Ambulatory Visit: Payer: Self-pay | Admitting: Obstetrics and Gynecology

## 2013-07-05 DIAGNOSIS — N6320 Unspecified lump in the left breast, unspecified quadrant: Secondary | ICD-10-CM

## 2013-07-19 ENCOUNTER — Other Ambulatory Visit: Payer: 59

## 2014-01-22 ENCOUNTER — Encounter (HOSPITAL_BASED_OUTPATIENT_CLINIC_OR_DEPARTMENT_OTHER): Payer: Self-pay | Admitting: Emergency Medicine

## 2014-03-18 ENCOUNTER — Encounter (HOSPITAL_BASED_OUTPATIENT_CLINIC_OR_DEPARTMENT_OTHER): Payer: Self-pay | Admitting: *Deleted

## 2014-03-18 ENCOUNTER — Emergency Department (HOSPITAL_BASED_OUTPATIENT_CLINIC_OR_DEPARTMENT_OTHER)
Admission: EM | Admit: 2014-03-18 | Discharge: 2014-03-18 | Disposition: A | Discharge status: Home / Self Care | Payer: 59 | Attending: Emergency Medicine | Admitting: Emergency Medicine

## 2014-03-18 DIAGNOSIS — G43009 Migraine without aura, not intractable, without status migrainosus: Secondary | ICD-10-CM | POA: Diagnosis not present

## 2014-03-18 DIAGNOSIS — Z79899 Other long term (current) drug therapy: Secondary | ICD-10-CM | POA: Diagnosis not present

## 2014-03-18 DIAGNOSIS — I159 Secondary hypertension, unspecified: Secondary | ICD-10-CM | POA: Insufficient documentation

## 2014-03-18 DIAGNOSIS — Z792 Long term (current) use of antibiotics: Secondary | ICD-10-CM | POA: Insufficient documentation

## 2014-03-18 DIAGNOSIS — Z8632 Personal history of gestational diabetes: Secondary | ICD-10-CM | POA: Diagnosis not present

## 2014-03-18 DIAGNOSIS — Z3202 Encounter for pregnancy test, result negative: Secondary | ICD-10-CM | POA: Diagnosis not present

## 2014-03-18 DIAGNOSIS — I10 Essential (primary) hypertension: Secondary | ICD-10-CM | POA: Insufficient documentation

## 2014-03-18 DIAGNOSIS — E119 Type 2 diabetes mellitus without complications: Secondary | ICD-10-CM | POA: Diagnosis not present

## 2014-03-18 DIAGNOSIS — R51 Headache: Secondary | ICD-10-CM | POA: Diagnosis present

## 2014-03-18 DIAGNOSIS — Z791 Long term (current) use of non-steroidal anti-inflammatories (NSAID): Secondary | ICD-10-CM | POA: Insufficient documentation

## 2014-03-18 LAB — PREGNANCY, URINE: Preg Test, Ur: NEGATIVE

## 2014-03-18 MED ORDER — DEXAMETHASONE SODIUM PHOSPHATE 10 MG/ML IJ SOLN
10.0000 mg | Freq: Once | INTRAMUSCULAR | Status: AC
  Administered 2014-03-18: 10 mg via INTRAVENOUS
  Filled 2014-03-18: qty 1

## 2014-03-18 MED ORDER — DIPHENHYDRAMINE HCL 50 MG/ML IJ SOLN
25.0000 mg | Freq: Once | INTRAMUSCULAR | Status: AC
  Administered 2014-03-18: 25 mg via INTRAVENOUS
  Filled 2014-03-18: qty 1

## 2014-03-18 MED ORDER — KETOROLAC TROMETHAMINE 30 MG/ML IJ SOLN
30.0000 mg | Freq: Once | INTRAMUSCULAR | Status: AC
  Administered 2014-03-18: 30 mg via INTRAVENOUS
  Filled 2014-03-18: qty 1

## 2014-03-18 MED ORDER — AMLODIPINE BESYLATE 5 MG PO TABS
5.0000 mg | ORAL_TABLET | Freq: Once | ORAL | Status: AC
  Administered 2014-03-18: 5 mg via ORAL
  Filled 2014-03-18: qty 1

## 2014-03-18 MED ORDER — LABETALOL HCL 5 MG/ML IV SOLN: 10.0000 mg | Freq: Once | INTRAVENOUS | Status: DC

## 2014-03-18 MED ORDER — METOCLOPRAMIDE HCL 5 MG/ML IJ SOLN
10.0000 mg | Freq: Once | INTRAMUSCULAR | Status: AC
  Administered 2014-03-18: 10 mg via INTRAVENOUS
  Filled 2014-03-18: qty 2

## 2014-03-18 MED ORDER — AMLODIPINE BESYLATE 5 MG PO TABS: 5.0000 mg | ORAL_TABLET | Freq: Every day | ORAL | Status: DC

## 2014-03-18 NOTE — ED Provider Notes (Signed)
CSN: [id number]     Arrival date & time 03/18/14  1521 History  This chart was scribed for Mirian MoMatthew Gentry, MD by Tonye RoyaltyJoshua Chen, ED Scribe. This patient was seen in room MH11/MH11 and the patient's care was started at 4:30 PM.    Chief Complaint  Patient presents with  . Migraine   Patient is a 28 y.o. female presenting with headaches. The history is provided by the patient. No language interpreter was used.  Headache Pain location:  Generalized Quality: throbbing. Radiates to:  Does not radiate Onset quality:  Sudden Timing:  Constant Progression:  Unchanged Chronicity:  Recurrent Similar to prior headaches: yes   Context: not eating   Context comment:  Hypertension Relieved by:  Nothing Worsened by:  Nothing tried Ineffective treatments: Excedrin. Associated symptoms: blurred vision and nausea   Associated symptoms: no fever, no numbness and no vomiting     HPI Comments: Madeline Cooke is a 28 y.o. female with history of hypertension who presents to the Emergency Department complaining of severe throbbing headache with onset last night. She states she woke with headache at 0100 last night then went back to sleep and it resolved this morning; it then recurred later this morning and it has been constant since. She reports associated bilateral blurry vision and nausea, but denies vomiting. She states she was recently taken off her blood pressure medication, and suspects it is related to her blood pressure, which measures here at 173/123. She states she has had similar headaches when she had high blood pressure like she does now. She states other headaches resolve with Excedrin, but the ones associated with high blood pressure do not. She states she did not eat excessively over the holidays, has been trying to drink a lot of fluids, and has not had alcohol recently. She states her last menstrual period was last week. She notes recent diagnosis of bacterial vaginosis for which she is taking  medication. She denies fever, numbness, or tingling.   Past Medical History  Diagnosis Date  . Diabetes mellitus   . Gestational diabetes   . Hypertension    Past Surgical History  Procedure Laterality Date  . Mole removed     Family History  Problem Relation Age of Onset  . Hypertension Maternal Grandfather   . Diabetes Maternal Grandfather   . Hypertension Paternal Grandmother   . Diabetes Paternal Grandmother    History  Substance Use Topics  . Smoking status: Never Smoker   . Smokeless tobacco: Never Used  . Alcohol Use: Yes     Comment: occasionally   OB History    Gravida Para Term Preterm AB TAB SAB Ectopic Multiple Living   5 4   1 1    4      Review of Systems  Constitutional: Negative for fever.  Eyes: Positive for blurred vision.  Gastrointestinal: Positive for nausea. Negative for vomiting.  Neurological: Positive for headaches. Negative for numbness.  All other systems reviewed and are negative.     Allergies  Review of patient's allergies indicates no known allergies.  Home Medications   Prior to Admission medications   Medication Sig Start Date End Date Taking? Authorizing Provider  amLODipine (NORVASC) 5 MG tablet Take 1 tablet (5 mg total) by mouth daily. 03/18/14   Mirian MoMatthew Gentry, MD  Carbonyl Iron (PERFECT IRON PO) Take 2 tablets by mouth daily.    Historical Provider, MD  cephALEXin (KEFLEX) 500 MG capsule Take 1 capsule (500 mg total) by mouth 4 (  four) times daily. 10/24/12   Marny LowensteinJulie N Wenzel, PA-C  Cholecalciferol (VITAMIN D-400 PO) Take 400 mg by mouth daily.    Historical Provider, MD  HYDROcodone-acetaminophen (NORCO) 5-325 MG per tablet Take 1 tablet by mouth every 6 (six) hours as needed for moderate pain. 05/07/13   Hope Orlene OchM Neese, NP  metroNIDAZOLE (FLAGYL) 500 MG tablet Take 1 tablet (500 mg total) by mouth 2 (two) times daily. 03/29/13   Gwyneth SproutWhitney Plunkett, MD  naproxen (NAPROSYN) 500 MG tablet Take 1 tablet (500 mg total) by mouth 2 (two)  times daily. 05/07/13   Hope Orlene OchM Neese, NP  Naproxen Sodium (ALEVE PO) Take 2 tablets by mouth daily as needed (heacache).    Historical Provider, MD   BP 171/106 mmHg  Pulse 60  Temp(Src) 98.7 F (37.1 C) (Oral)  Resp 16  Ht 5\' 3"  (1.6 m)  Wt 245 lb (111.131 kg)  BMI 43.41 kg/m2  SpO2 99% Physical Exam  Constitutional: She is oriented to person, place, and time. She appears well-developed and well-nourished.  HENT:  Head: Normocephalic and atraumatic.  Right Ear: External ear normal.  Left Ear: External ear normal.  Eyes: Conjunctivae and EOM are normal. Pupils are equal, round, and reactive to light.  Neck: Normal range of motion. Neck supple.  Cardiovascular: Normal rate, regular rhythm, normal heart sounds and intact distal pulses.   Pulmonary/Chest: Effort normal and breath sounds normal.  Abdominal: Soft. Bowel sounds are normal. There is no tenderness.  Musculoskeletal: Normal range of motion.  Neurological: She is alert and oriented to person, place, and time. She has normal strength. No cranial nerve deficit or sensory deficit. GCS eye subscore is 4. GCS verbal subscore is 5. GCS motor subscore is 6.  Skin: Skin is warm and dry.  Vitals reviewed.   ED Course  Procedures (including critical care time)  DIAGNOSTIC STUDIES: Oxygen Saturation is 100% on room air, normal by my interpretation.    COORDINATION OF CARE: 4:36 PM Discussed treatment plan with patient at beside, the patient agrees with the plan and has no further questions at this time.   Labs Review Labs Reviewed  PREGNANCY, URINE    Imaging Review No results found.   EKG Interpretation None      MDM   Final diagnoses:  Migraine without aura and without status migrainosus, not intractable  Secondary hypertension, unspecified    28 y.o. female with pertinent PMH of HTN, migraine presents with recurrent HTN and migraine.  No concerning historical features.  Although the pt does endorse blurred  vision, she describes it as extremely mild, bilateral, and occurs at times without headache. On arrival today vitals signs and physical exam as above. No focal neurodeficits. Patient had relief of symptoms with migraine cocktail. Discharged home with standard return precautions for hypertension and headache.  I have reviewed all laboratory and imaging studies if ordered as above  1. Migraine without aura and without status migrainosus, not intractable   2. Secondary hypertension, unspecified           Mirian MoMatthew Gentry, MD 03/18/14 1815

## 2014-03-18 NOTE — Discharge Instructions (Signed)
Migraine Headache °A migraine headache is an intense, throbbing pain on one or both sides of your head. A migraine can last for 30 minutes to several hours. °CAUSES  °The exact cause of a migraine headache is not always known. However, a migraine may be caused when nerves in the brain become irritated and release chemicals that cause inflammation. This causes pain. °Certain things may also trigger migraines, such as: °· Alcohol. °· Smoking. °· Stress. °· Menstruation. °· Aged cheeses. °· Foods or drinks that contain nitrates, glutamate, aspartame, or tyramine. °· Lack of sleep. °· Chocolate. °· Caffeine. °· Hunger. °· Physical exertion. °· Fatigue. °· Medicines used to treat chest pain (nitroglycerine), birth control pills, estrogen, and some blood pressure medicines. °SIGNS AND SYMPTOMS °· Pain on one or both sides of your head. °· Pulsating or throbbing pain. °· Severe pain that prevents daily activities. °· Pain that is aggravated by any physical activity. °· Nausea, vomiting, or both. °· Dizziness. °· Pain with exposure to bright lights, loud noises, or activity. °· General sensitivity to bright lights, loud noises, or smells. °Before you get a migraine, you may get warning signs that a migraine is coming (aura). An aura may include: °· Seeing flashing lights. °· Seeing bright spots, halos, or zigzag lines. °· Having tunnel vision or blurred vision. °· Having feelings of numbness or tingling. °· Having trouble talking. °· Having muscle weakness. °DIAGNOSIS  °A migraine headache is often diagnosed based on: °· Symptoms. °· Physical exam. °· A CT scan or MRI of your head. These imaging tests cannot diagnose migraines, but they can help rule out other causes of headaches. °TREATMENT °Medicines may be given for pain and nausea. Medicines can also be given to help prevent recurrent migraines.  °HOME CARE INSTRUCTIONS °· Only take over-the-counter or prescription medicines for pain or discomfort as directed by your  health care provider. The use of long-term narcotics is not recommended. °· Lie down in a dark, quiet room when you have a migraine. °· Keep a journal to find out what may trigger your migraine headaches. For example, write down: °¨ What you eat and drink. °¨ How much sleep you get. °¨ Any change to your diet or medicines. °· Limit alcohol consumption. °· Quit smoking if you smoke. °· Get 7-9 hours of sleep, or as recommended by your health care provider. °· Limit stress. °· Keep lights dim if bright lights bother you and make your migraines worse. °SEEK IMMEDIATE MEDICAL CARE IF:  °· Your migraine becomes severe. °· You have a fever. °· You have a stiff neck. °· You have vision loss. °· You have muscular weakness or loss of muscle control. °· You start losing your balance or have trouble walking. °· You feel faint or pass out. °· You have severe symptoms that are different from your first symptoms. °MAKE SURE YOU:  °· Understand these instructions. °· Will watch your condition. °· Will get help right away if you are not doing well or get worse. °Document Released: 03/09/2005 Document Revised: 07/24/2013 Document Reviewed: 11/14/2012 °[employer]® Patient Information ©2015 [employer]. This information is not intended to replace advice given to you by your health care provider. Make sure you discuss any questions you have with your health care provider. ° °Hypertension °Hypertension, commonly called high blood pressure, is when the force of blood pumping through your arteries is too strong. Your arteries are the blood vessels that carry blood from your heart throughout your body. A blood pressure reading   consists of a higher number over a lower number, such as 110/72. The higher number (systolic) is the pressure inside your arteries when your heart pumps. The lower number (diastolic) is the pressure inside your arteries when your heart relaxes. Ideally you want your blood pressure below 120/80. Hypertension forces  your heart to work harder to pump blood. Your arteries may become narrow or stiff. Having hypertension puts you at risk for heart disease, stroke, and other problems.  RISK FACTORS Some risk factors for high blood pressure are controllable. Others are not.  Risk factors you cannot control include:   Race. You may be at higher risk if you are African American.  Age. Risk increases with age.  Gender. Men are at higher risk than women before age 18 years. After age 76, women are at higher risk than men. Risk factors you can control include:  Not getting enough exercise or physical activity.  Being overweight.  Getting too much fat, sugar, calories, or salt in your diet.  Drinking too much alcohol. SIGNS AND SYMPTOMS Hypertension does not usually cause signs or symptoms. Extremely high blood pressure (hypertensive crisis) may cause headache, anxiety, shortness of breath, and nosebleed. DIAGNOSIS  To check if you have hypertension, your health care provider will measure your blood pressure while you are seated, with your arm held at the level of your heart. It should be measured at least twice using the same arm. Certain conditions can cause a difference in blood pressure between your right and left arms. A blood pressure reading that is higher than normal on one occasion does not mean that you need treatment. If one blood pressure reading is high, ask your health care provider about having it checked again. TREATMENT  Treating high blood pressure includes making lifestyle changes and possibly taking medicine. Living a healthy lifestyle can help lower high blood pressure. You may need to change some of your habits. Lifestyle changes may include:  Following the DASH diet. This diet is high in fruits, vegetables, and whole grains. It is low in salt, red meat, and added sugars.  Getting at least 2 hours of brisk physical activity every week.  Losing weight if necessary.  Not  smoking.  Limiting alcoholic beverages.  Learning ways to reduce stress. If lifestyle changes are not enough to get your blood pressure under control, your health care provider may prescribe medicine. You may need to take more than one. Work closely with your health care provider to understand the risks and benefits. HOME CARE INSTRUCTIONS  Have your blood pressure rechecked as directed by your health care provider.   Take medicines only as directed by your health care provider. Follow the directions carefully. Blood pressure medicines must be taken as prescribed. The medicine does not work as well when you skip doses. Skipping doses also puts you at risk for problems.   Do not smoke.   Monitor your blood pressure at home as directed by your health care provider. SEEK MEDICAL CARE IF:   You think you are having a reaction to medicines taken.  You have recurrent headaches or feel dizzy.  You have swelling in your ankles.  You have trouble with your vision. SEEK IMMEDIATE MEDICAL CARE IF:  You develop a severe headache or confusion.  You have unusual weakness, numbness, or feel faint.  You have severe chest or abdominal pain.  You vomit repeatedly.  You have trouble breathing. MAKE SURE YOU:   Understand these instructions.  Will watch your  condition.  Will get help right away if you are not doing well or get worse. Document Released: 03/09/2005 Document Revised: 07/24/2013 Document Reviewed: 12/30/2012 [facility] Patient Information 2015 [place], [place]. This information is not intended to replace advice given to you by your health care provider. Make sure you discuss any questions you have with your health care provider.

## 2014-03-18 NOTE — ED Notes (Addendum)
Patient states she has had a severe headache since last night. She used to take BP medication, but was taken off of it so she is concerned her HA is due to HTN. States that she only gets severe headaches, her blood pressure is elevated.

## 2014-05-02 ENCOUNTER — Emergency Department (HOSPITAL_BASED_OUTPATIENT_CLINIC_OR_DEPARTMENT_OTHER)
Admission: EM | Admit: 2014-05-02 | Discharge: 2014-05-02 | Disposition: A | Discharge status: Home / Self Care | Payer: 59 | Attending: Emergency Medicine | Admitting: Emergency Medicine

## 2014-05-02 ENCOUNTER — Encounter (HOSPITAL_BASED_OUTPATIENT_CLINIC_OR_DEPARTMENT_OTHER): Payer: Self-pay | Admitting: *Deleted

## 2014-05-02 DIAGNOSIS — Z3202 Encounter for pregnancy test, result negative: Secondary | ICD-10-CM | POA: Insufficient documentation

## 2014-05-02 DIAGNOSIS — Z8632 Personal history of gestational diabetes: Secondary | ICD-10-CM | POA: Insufficient documentation

## 2014-05-02 DIAGNOSIS — E119 Type 2 diabetes mellitus without complications: Secondary | ICD-10-CM | POA: Diagnosis not present

## 2014-05-02 DIAGNOSIS — Z79899 Other long term (current) drug therapy: Secondary | ICD-10-CM | POA: Diagnosis not present

## 2014-05-02 DIAGNOSIS — Z792 Long term (current) use of antibiotics: Secondary | ICD-10-CM | POA: Diagnosis not present

## 2014-05-02 DIAGNOSIS — Z791 Long term (current) use of non-steroidal anti-inflammatories (NSAID): Secondary | ICD-10-CM | POA: Insufficient documentation

## 2014-05-02 DIAGNOSIS — K529 Noninfective gastroenteritis and colitis, unspecified: Secondary | ICD-10-CM | POA: Insufficient documentation

## 2014-05-02 DIAGNOSIS — R000 Tachycardia, unspecified: Secondary | ICD-10-CM | POA: Insufficient documentation

## 2014-05-02 DIAGNOSIS — I10 Essential (primary) hypertension: Secondary | ICD-10-CM | POA: Diagnosis not present

## 2014-05-02 DIAGNOSIS — R1013 Epigastric pain: Secondary | ICD-10-CM | POA: Diagnosis present

## 2014-05-02 LAB — PREGNANCY, URINE: Preg Test, Ur: NEGATIVE

## 2014-05-02 LAB — URINALYSIS, ROUTINE W REFLEX MICROSCOPIC
BILIRUBIN URINE: NEGATIVE
Glucose, UA: NEGATIVE mg/dL
Ketones, ur: NEGATIVE mg/dL
LEUKOCYTES UA: NEGATIVE
NITRITE: NEGATIVE
PH: 7.5 (ref 5.0–8.0)
Protein, ur: NEGATIVE mg/dL
SPECIFIC GRAVITY, URINE: 1.012 (ref 1.005–1.030)
Urobilinogen, UA: 0.2 mg/dL (ref 0.0–1.0)

## 2014-05-02 LAB — COMPREHENSIVE METABOLIC PANEL
ALBUMIN: 4 g/dL (ref 3.5–5.2)
ALT: 15 U/L (ref 0–35)
AST: 16 U/L (ref 0–37)
Alkaline Phosphatase: 80 U/L (ref 39–117)
Anion gap: 4 — ABNORMAL LOW (ref 5–15)
BUN: 9 mg/dL (ref 6–23)
CALCIUM: 8.7 mg/dL (ref 8.4–10.5)
CO2: 24 mmol/L (ref 19–32)
Chloride: 109 mmol/L (ref 96–112)
Creatinine, Ser: 0.81 mg/dL (ref 0.50–1.10)
GFR calc Af Amer: >90 mL/min (ref >90)
GLUCOSE: 106 mg/dL — AB (ref 70–99)
POTASSIUM: 3.7 mmol/L (ref 3.5–5.1)
Sodium: 137 mmol/L (ref 135–145)
Total Bilirubin: 0.6 mg/dL (ref 0.3–1.2)
Total Protein: 7.8 g/dL (ref 6.0–8.3)

## 2014-05-02 LAB — CBC
HEMATOCRIT: 37.5 % (ref 36.0–46.0)
HEMOGLOBIN: 12.6 g/dL (ref 12.0–15.0)
MCH: 25.3 pg — ABNORMAL LOW (ref 26.0–34.0)
MCHC: 33.6 g/dL (ref 30.0–36.0)
MCV: 75.2 fL — ABNORMAL LOW (ref 78.0–100.0)
Platelets: 194 10*3/uL (ref 150–400)
RBC: 4.99 MIL/uL (ref 3.87–5.11)
RDW: 14.5 % (ref 11.5–15.5)
WBC: 7.3 10*3/uL (ref 4.0–10.5)

## 2014-05-02 LAB — URINE MICROSCOPIC-ADD ON

## 2014-05-02 LAB — LIPASE, BLOOD: LIPASE: 35 U/L (ref 11–59)

## 2014-05-02 MED ORDER — SODIUM CHLORIDE 0.9 % IV BOLUS (SEPSIS)
1000.0000 mL | Freq: Once | INTRAVENOUS | Status: AC
  Administered 2014-05-02: 1000 mL via INTRAVENOUS

## 2014-05-02 MED ORDER — ONDANSETRON 4 MG PO TBDP: ORAL_TABLET | ORAL | Status: DC

## 2014-05-02 MED ORDER — MORPHINE SULFATE 4 MG/ML IJ SOLN
4.0000 mg | Freq: Once | INTRAMUSCULAR | Status: AC
  Administered 2014-05-02: 4 mg via INTRAVENOUS
  Filled 2014-05-02: qty 1

## 2014-05-02 MED ORDER — HYDROCODONE-ACETAMINOPHEN 5-325 MG PO TABS: 1.0000 | ORAL_TABLET | Freq: Four times a day (QID) | ORAL | Status: DC | PRN

## 2014-05-02 MED ORDER — ONDANSETRON HCL 4 MG/2ML IJ SOLN
4.0000 mg | Freq: Once | INTRAMUSCULAR | Status: AC
  Administered 2014-05-02: 4 mg via INTRAVENOUS
  Filled 2014-05-02: qty 2

## 2014-05-02 MED ORDER — SODIUM CHLORIDE 0.9 % IV BOLUS (SEPSIS)
500.0000 mL | Freq: Once | INTRAVENOUS | Status: AC
  Administered 2014-05-02: 500 mL via INTRAVENOUS

## 2014-05-02 MED ORDER — ACETAMINOPHEN 500 MG PO TABS
1000.0000 mg | ORAL_TABLET | Freq: Once | ORAL | Status: AC
  Administered 2014-05-02: 1000 mg via ORAL
  Filled 2014-05-02: qty 2

## 2014-05-02 NOTE — Discharge Instructions (Signed)
1. Medications: zofran, vicodin, usual home medications °2. Treatment: rest, drink plenty of fluids, advance diet slowly °3. Follow Up: Please followup with your primary doctor in 2 days for discussion of your diagnoses and further evaluation after today's visit; if you do not have a primary care doctor use the resource guide provided to find one; Please return to the ER for persistent vomiting, high fevers or worsening symptoms ° ° °Viral Gastroenteritis °Viral gastroenteritis is also known as stomach flu. This condition affects the stomach and intestinal tract. It can cause sudden diarrhea and vomiting. The illness typically lasts 3 to 8 days. Most people develop an immune response that eventually gets rid of the virus. While this natural response develops, the virus can make you quite ill. °CAUSES  °Many different viruses can cause gastroenteritis, such as rotavirus or noroviruses. You can catch one of these viruses by consuming contaminated food or water. You may also catch a virus by sharing utensils or other personal items with an infected person or by touching a contaminated surface. °SYMPTOMS  °The most common symptoms are diarrhea and vomiting. These problems can cause a severe loss of body fluids (dehydration) and a body salt (electrolyte) imbalance. Other symptoms may include: °· Fever. °· Headache. °· Fatigue. °· Abdominal pain. °DIAGNOSIS  °Your caregiver can usually diagnose viral gastroenteritis based on your symptoms and a physical exam. A stool sample may also be taken to test for the presence of viruses or other infections. °TREATMENT  °This illness typically goes away on its own. Treatments are aimed at rehydration. The most serious cases of viral gastroenteritis involve vomiting so severely that you are not able to keep fluids down. In these cases, fluids must be given through an intravenous line (IV). °HOME CARE INSTRUCTIONS  °· Drink enough fluids to keep your urine clear or pale yellow. Drink  small amounts of fluids frequently and increase the amounts as tolerated. °· Ask your caregiver for specific rehydration instructions. °· Avoid: °¨ Foods high in sugar. °¨ Alcohol. °¨ Carbonated drinks. °¨ Tobacco. °¨ Juice. °¨ Caffeine drinks. °¨ Extremely hot or cold fluids. °¨ Fatty, greasy foods. °¨ Too much intake of anything at one time. °¨ Dairy products until 24 to 48 hours after diarrhea stops. °· You may consume probiotics. Probiotics are active cultures of beneficial bacteria. They may lessen the amount and number of diarrheal stools in adults. Probiotics can be found in yogurt with active cultures and in supplements. °· Wash your hands well to avoid spreading the virus. °· Only take over-the-counter or prescription medicines for pain, discomfort, or fever as directed by your caregiver. Do not give aspirin to children. Antidiarrheal medicines are not recommended. °· Ask your caregiver if you should continue to take your regular prescribed and over-the-counter medicines. °· Keep all follow-up appointments as directed by your caregiver. °SEEK IMMEDIATE MEDICAL CARE IF:  °· You are unable to keep fluids down. °· You do not urinate at least once every 6 to 8 hours. °· You develop shortness of breath. °· You notice blood in your stool or vomit. This may look like coffee grounds. °· You have abdominal pain that increases or is concentrated in one small area (localized). °· You have persistent vomiting or diarrhea. °· You have a fever. °· The patient is a child younger than 3 months, and he or she has a fever. °· The patient is a child older than 3 months, and he or she has a fever and persistent symptoms. °· The   patient is a child older than 3 months, and he or she has a fever and symptoms suddenly get worse.  The patient is a baby, and he or she has no tears when crying. MAKE SURE YOU:   Understand these instructions.  Will watch your condition.  Will get help right away if you are not doing well or  get worse. Document Released: 03/09/2005 Document Revised: 06/01/2011 Document Reviewed: 12/24/2010 [facility] Patient Information 2015 [place], [place]. This information is not intended to replace advice given to you by your health care provider. Make sure you discuss any questions you have with your health care provider.   Emergency Department Resource Guide 1) Find a Doctor and Pay Out of Pocket Although you won't have to find out who is covered by your insurance plan, it is a good idea to ask around and get recommendations. You will then need to call the office and see if the doctor you have chosen will accept you as a new patient and what types of options they offer for patients who are self-pay. Some doctors offer discounts or will set up payment plans for their patients who do not have insurance, but you will need to ask so you aren't surprised when you get to your appointment.  2) Contact Your Local Health Department Not all health departments have doctors that can see patients for sick visits, but many do, so it is worth a call to see if yours does. If you don't know where your local health department is, you can check in your phone book. The CDC also has a tool to help you locate your state's health department, and many state websites also have listings of all of their local health departments.  3) Find a Walk-in Clinic If your illness is not likely to be very severe or complicated, you may want to try a walk in clinic. These are popping up all over the country in pharmacies, drugstores, and shopping centers. They're usually staffed by nurse practitioners or physician assistants that have been trained to treat common illnesses and complaints. They're usually fairly quick and inexpensive. However, if you have serious medical issues or chronic medical problems, these are probably not your best option.  No Primary Care Doctor: - Call Health Connect at  [phone] - they can help you locate a  primary care doctor that  accepts your insurance, provides certain services, etc. - Physician Referral Service- [phone]  Chronic Pain Problems: Organization         Address  Phone   Notes  [facility]  [phone] Patients need to be referred by their primary care doctor.   Medication Assistance: Organization         Address  Phone   Notes  [facility] [address] [place] [place], [place] [address] [phone] --Must be a resident of [facility] -- Must have NO insurance coverage whatsoever (no Medicaid/ Medicare, etc.) -- The pt. MUST have a primary care doctor that directs their care regularly and follows them in the community   [facility]  [phone]   [employer]  [phone]    Agencies that provide inexpensive medical care: Organization         Address  Phone   Notes  [facility]  [phone]   [facility]    [phone]   [facility] [address] [place], [place] [address] [phone]  [facility] [address], °[place] (336) [phone]   °[facility]anned Parenthood    (336) 373-0678   °[facility]    (336) 272-1050   °[facility] ° [address], [place] Phone:  (336) [phone], Fax:  ([phone]) [phone] Hours of Operation:  9 am - 6 pm, M-F.  Also accepts Medicaid/Medicare and self-pay.  °[facility] ° [address], [place] Phone: ([phone]) [phone], Fax: ([phone]) [phone]. Hours of Operation:  8:30 am - 5:30 pm, M-F.  Also accepts Medicaid and self-pay.  °[facility] [place] [address], [place] Phone: (336) [phone]   °[facility] [address], [place], [place] (336)723-1848, Ext. 123 Mondays & Thursdays: 7-9 AM.  First 15 patients are seen on a first come, first serve basis. °  ° °Medicaid-accepting [place]  Providers: ° °Organization         Address  Phone   Notes  °[facility] [address], [place] ([phone]) [phone] Also accepts self-pay patients.  °[facility] [address], [place] ° (336) [phone]   °[facility] [address], [place] (336) [phone]   °[facility] [address] [place] [address], [place] (336) 299-7000   °Veita Bland [address], [place]  ° (336) [phone] Only accepts [place] Access Medicaid patients after they have their name applied to their card.  ° °Self-Pay (no insurance) in [place]: ° °Organization         Address  Phone   Notes  °Sickle Cell Patients, [facility] [address], [place] (336) [phone]   °[facility] [address], [place] (336) 832-4400   °[facility] ° [address], [place] (336) [phone]   °[facility]/Dr. Osei-Bonsu ° [address] [place] [address], [place] or [address], [place] (336) [phone] Phone number for both [place] and [place] locations is the same.  °[facility] [address], [place] (336) [phone]   °[facility] [address] [place] [address], [place] or [address] (336) 852-7530 °(336) 878-2260   °[facility] [address], [place] (336) 350-1642, phone; (336) [phone], fax Sees patients 1st and 3rd Saturday of every month.  Must not qualify for public or private insurance (i.e. Medicaid, Medicare, [place] Health Choice, Veterans' Benefits) • Household income should be no more than 200% of the poverty level •The clinic cannot treat you if you are pregnant or think you are pregnant • Sexually transmitted diseases are not treated at the clinic.  ° ° °Dental Care: °Organization         Address  Phone  Notes  °[place] [facility] [address], [place] (336) 641-6152 Accepts children up to age 21 who are enrolled in Medicaid or [place] Health Choice; pregnant women with a Medicaid card; and children who have applied for Medicaid or [place] Health Choice, but were declined, whose parents can pay a reduced fee at time of service.  °[place] [facility] [place]  [address], [place] (336) 641-7733 Accepts children up to age 21 who are enrolled in Medicaid or [place] Health Choice; pregnant women with a Medicaid card; and   children who have applied for Medicaid or [place] Health Choice, but were declined, whose parents can pay a reduced fee at time of service.  °[facility] ° [address], [place] (336) 641-4533 Patients are seen by appointment only. Walk-ins are not accepted. [facility] will see patients 18 years of age and older. °Monday - Tuesday (8am-5pm) °Most Wednesdays (8:30-5pm) °$30 per visit, cash only  °[facility] ° [address], [place] (336) [phone] Patients are seen by appointment only. Walk-ins are not accepted. [facility] will see patients 18 years of age and older. °One Wednesday Evening (Monthly: Volunteer Based).  $30 per visit, cash only  °[facility]  ([phone]) [phone] for adults; Children under age 4, call Graduate Pediatric Dentistry at ([phone]) [phone]. Children aged 4-14, please call ([phone]) [phone] to request a pediatric application. ° Dental services are provided in all areas of dental care including fillings, crowns and bridges, complete and partial dentures, implants, gum treatment, root canals, and extractions. Preventive care is also provided. Treatment is provided to both adults and children. °Patients are selected via a lottery and there is often a waiting list. °  °[facility] [address], °[place] ° (336) 763-8833 [email or web address] °  °[facility]  [address], [place], [place] (336)723-1848, Ext. 123 Second and Fourth Thursday of each month, opens at 6:30 AM; Clinic ends at 9 AM.  Patients are seen on a first-come first-served basis, and a limited number are seen during each clinic.  ° °[facility] ° [address], [place], [place] (336) 723-7904   Eligibility Requirements °You must have lived in [place], [place], or [place] counties for at least the last three months. °  You cannot be eligible for state or federal sponsored healthcare insurance, including Veterans Administration, Medicaid, or Medicare. °  You generally cannot be eligible for healthcare insurance through your employer.  °  How to apply: °Eligibility screenings are held every Tuesday and Wednesday afternoon from 1:00 pm until 4:00 pm. You do not need an appointment for the interview!  °[facility] [address], [place], [place] [phone]   °[facility]  336-342-8273   °[facility]  336-703-3100   °[facility]  336-570-6415   ° °Behavioral Health Resources in the Community: °Intensive Outpatient Programs °Organization         Address  Phone  Notes  °[facility] [address], [place], [place] 336-878-6098   °[facility] [address], [place], [place] 336-832-9800   °[facility] [address], [place], [place] ° 336-882-2125   °[facility] [address],  °[place], [place] 1-800-853-5163 or 336-641-4981   °Substance Abuse Resources °Organization         Address  Phone  Notes  °[facility]  336-882-2125   °[facility]  336-784-9470   °[facility]  336-285-9073   °[facility]  336-845-3988   °Residential & Outpatient Substance Abuse Program  1-800-659-3381   °Psychological Services °Organization         Address  Phone  Notes  °[facility]  336- 832-9600    °[facility]  336- 378-7881   °[facility] [address], [place] 1-800-853-5163 or 336-641-4981   ° °Mobile Crisis Teams °Organization           Address  Phone  Notes  [facility], Mobile Crisis Care Unit  ([phone]   [facility]  [address] [place], [place] [phone]   [facility] [address] [place] [phone]    Self-Help/Support Groups Organization         Address  Phone             Notes  [facility] [place] - variety of support groups  336- I7437963(978)231-6546 Call for more information  Narcotics Anonymous (NA), [facility] [address], [place]  2 meetings at this location   [facility] Organization         Address  Phone  Notes  [facility] [address],    [place]  [phone]   [facility]  [address], [place] [phone], [place], [place] [phone]   [facility] [address], [place] [phone] Admissions: 8am-3pm M-F  [facility] [address]    [place], [place] [phone]   [facility] [address], [place], [place] [phone]   [facility] [address]  [place], [place] [phone]) [phone]   [facility] - Intensive Outpatient [address], [place], [place] [phone]   [facility] ([facility] [address]  [place], [place] [phone] or [phone]   [facility] ([facility]) [address] [place], [place] [phone] Accepts Medicaid  [facility] [address]  [place] [phone] Substance Abuse/Addiction Treatment   [facility] Resources Organization         Address  Phone  Notes  [facility]  [phone](888) ([phone])[phone]   Angie FavaJulie Brannon, PhD [address], [address] [place], [place]   [phone](336) [phone] or [phone](336) [phone]    [facility]    [address] [place], [place] [phone](336) [phone]   [facility] [address], [place], [place] [phone](336) [phone] Insurance/Medicaid/sponsorship through [facility]  [facility] [address]                                    [place], [place] [phone](336) [phone] Therapy/tele-psych/case  [facility] [address].   [place], [place] [phone]) [phone]    Dr. Lolly MustacheArfeen  [phone]) [phone]   [facility]  [employer] [facility] 1) [address],  2) [address], [place] 3)  [address], [place] [phone](336) [phone](336) [phone]  [phone](336) [phone]   [facility] Child Abuse Hotline 6503066820(336) (760)[phone] or (425)[phone](336) [phone] (After Hours)

## 2014-05-02 NOTE — ED Notes (Signed)
Dahlia ClientHannah PA aware of pt axillary temp and that pt wants to drive self. PA states okay for pt to drive self home.

## 2014-05-02 NOTE — ED Provider Notes (Signed)
CSN: [id number]     Arrival date & time 05/02/14  4098 History   First MD Initiated Contact with Patient 05/02/14 1201     Chief Complaint  Patient presents with  . Abdominal Pain     (Consider location/radiation/quality/duration/timing/severity/associated sxs/prior Treatment) The history is provided by the patient and medical records. No language interpreter was used.     Madeline Cooke is a 29 y.o. female  with a hx of HTN, gestational diabetes presents to the Emergency Department complaining of waxing and waning, epigastric, cramping abdominal pain onset 3AM today.  Pt reports she initially had diarrhea that was then followed by vomiting.  Pt reports NBNB emesis and denies melena or hematochezia.  Pt describes her pain as cramping, rated at a 0/10 at this time, but was at 8/10 at its worst.  Pt reports the pain comes just before the need to vomit or have diarrhea.  Pt with associated myalgias and chills.  No treatments PTA.  Pt reports he children recently had a GI virus with vomiting and diarrhea. No aggravating or alleviating factors.  Pt denies fever, headache, neck pain, neck stiffness, CP, SOB, weakness, dizziness, syncope, dysuria, hematuia.  LMP: pt has explanon - no menses     Past Medical History  Diagnosis Date  . Diabetes mellitus   . Gestational diabetes   . Hypertension    Past Surgical History  Procedure Laterality Date  . Mole removed     Family History  Problem Relation Age of Onset  . Hypertension Maternal Grandfather   . Diabetes Maternal Grandfather   . Hypertension Paternal Grandmother   . Diabetes Paternal Grandmother    History  Substance Use Topics  . Smoking status: Never Smoker   . Smokeless tobacco: Never Used  . Alcohol Use: Yes     Comment: occasionally   OB History    Gravida Para Term Preterm AB TAB SAB Ectopic Multiple Living   Review of Systems  Constitutional: Positive for chills. Negative for fever, diaphoresis,  appetite change, fatigue and unexpected weight change.  HENT: Negative for mouth sores.   Eyes: Negative for visual disturbance.  Respiratory: Negative for cough, chest tightness, shortness of breath and wheezing.   Cardiovascular: Negative for chest pain.  Gastrointestinal: Positive for nausea, vomiting, abdominal pain and diarrhea. Negative for constipation.  Endocrine: Negative for polydipsia, polyphagia and polyuria.  Genitourinary: Negative for dysuria, urgency, frequency and hematuria.  Musculoskeletal: Negative for back pain and neck stiffness.  Skin: Negative for rash.  Allergic/Immunologic: Negative for immunocompromised state.  Neurological: Negative for syncope, light-headedness and headaches.  Hematological: Does not bruise/bleed easily.  Psychiatric/Behavioral: Negative for sleep disturbance. The patient is not nervous/anxious.       Allergies  Review of patient's allergies indicates no known allergies.  Home Medications   Prior to Admission medications   Medication Sig Start Date End Date Taking? Authorizing Provider  amLODipine (NORVASC) 5 MG tablet Take 1 tablet (5 mg total) by mouth daily. 03/18/14  Yes Mirian Mo, MD  Carbonyl Iron (PERFECT IRON PO) Take 2 tablets by mouth daily.    Historical Provider, MD  cephALEXin (KEFLEX) 500 MG capsule Take 1 capsule (500 mg total) by mouth 4 (four) times daily. 10/24/12   Marny Lowenstein, PA-C  Cholecalciferol (VITAMIN D-400 PO) Take 400 mg by mouth daily.    Historical Provider, MD  HYDROcodone-acetaminophen (NORCO/VICODIN) 5-325 MG per tablet Take 1-2  tablets by mouth every 6 (six) hours as needed for moderate pain or severe pain. 05/02/14   Syan Cullimore, PA-C  metroNIDAZOLE (FLAGYL) 500 MG tablet Take 1 tablet (500 mg total) by mouth 2 (two) times daily. 03/29/13   Gwyneth Sprout, MD  naproxen (NAPROSYN) 500 MG tablet Take 1 tablet (500 mg total) by mouth 2 (two) times daily. 05/07/13   Hope Orlene Och, NP  Naproxen  Sodium (ALEVE PO) Take 2 tablets by mouth daily as needed (heacache).    Historical Provider, MD  ondansetron (ZOFRAN ODT) 4 MG disintegrating tablet  ODT q4 hours prn nausea/vomit 05/02/14   Genell Thede, PA-C   BP 162/104 mmHg  Pulse 118  Temp(Src) 100.1 F (37.8 C) (Oral)  Resp 20  Ht  (1.6 m)  Wt 245 lb (111.131 kg)  BMI 43.41 kg/m2  SpO2 100% Physical Exam  Constitutional: She appears well-developed and well-nourished.  HENT:  Head: Normocephalic and atraumatic.  Mouth/Throat: Oropharynx is clear and moist.  Eyes: Conjunctivae are normal. No scleral icterus.  Cardiovascular: Regular rhythm, normal heart sounds and intact distal pulses.   Tachycardia  Pulmonary/Chest: Effort normal and breath sounds normal.  Abdominal: Soft. Bowel sounds are normal. She exhibits no distension and no mass. There is tenderness in the epigastric area. There is no rebound and no guarding.  Mild epigastric tenderness without guarding, rebound or peritoneal signs  Neurological: She is alert.  Skin: Skin is warm and dry.  Psychiatric: She has a normal mood and affect.  Nursing note and vitals reviewed.   ED Course  Procedures (including critical care time) Labs Review Labs Reviewed  URINALYSIS, ROUTINE W REFLEX MICROSCOPIC - Abnormal; Notable for the following:    Hgb urine dipstick TRACE (*)    All other components within normal limits  CBC - Abnormal; Notable for the following:    MCV 75.2 (*)    MCH 25.3 (*)    All other components within normal limits  COMPREHENSIVE METABOLIC PANEL - Abnormal; Notable for the following:    Glucose, Bld 106 (*)    Anion gap 4 (*)    All other components within normal limits  PREGNANCY, URINE  URINE MICROSCOPIC-ADD ON  LIPASE, BLOOD    Imaging Review No results found.   EKG Interpretation None      MDM   Final diagnoses:  Gastroenteritis   Madeline Cooke presents with N/V/D and intermittent cramping abd pain just prior to  vomiting or diarrhea after being exposed to the same from her children.  Pt with mild tachycardia and feels febrile on tactile exam.  Will give fluids, reassess.  Labs reassuring.    4:08 PM Patient with symptoms consistent with viral gastroenteritis.  Vitals are stable, low grade fever.  Mild signs of dehydration, tolerating PO fluids > 6 oz.  Lungs are clear.  IV fluids given.  Labs reassuring.  No focal abdominal pain, no concern for appendicitis, cholecystitis, pancreatitis, ruptured viscus, UTI, kidney stone, or any other abdominal etiology.  Pt has tolerated PO fluids and solids.  She reports she feels much better and wishes for discharge home.  Supportive therapy indicated with return if symptoms worsen.  Patient counseled.  I have personally reviewed patient's vitals, nursing note and any pertinent labs or imaging.  I performed an focused physical exam; undressed when appropriate .    It has been determined that no acute conditions requiring further emergency intervention are present at this time. The patient/guardian have been advised of the  diagnosis and plan. I reviewed any labs and imaging including any potential incidental findings. We have discussed signs and symptoms that warrant return to the ED and they are listed in the discharge instructions.    Vital signs are stable at discharge.   BP 152/96 mmHg  Pulse 107  Temp(Src) 99.2 F (37.3 C) (Oral)  Resp 20  Ht 5\' 3"  (1.6 m)  Wt 245 lb (111.131 kg)  BMI 43.41 kg/m2  SpO2 98%         Dierdre ForthHannah Jourdain Guay, PA-C 05/02/14 1627  Arby BarretteMarcy Pfeiffer, MD 05/03/14 [phone]

## 2014-05-02 NOTE — ED Notes (Signed)
Sprite and crackers given to pt.

## 2014-05-02 NOTE — ED Notes (Signed)
Patient states she developed abdominal pain today at 0300 which was associated with diarrhea.  States vomiting started at 0600.  Now she has generalized body aches, abdominal pain , and vomiting.  States she has three children who recently had the same.

## 2014-06-06 ENCOUNTER — Encounter (HOSPITAL_BASED_OUTPATIENT_CLINIC_OR_DEPARTMENT_OTHER): Payer: Self-pay

## 2014-06-06 DIAGNOSIS — Z792 Long term (current) use of antibiotics: Secondary | ICD-10-CM | POA: Diagnosis not present

## 2014-06-06 DIAGNOSIS — Z791 Long term (current) use of non-steroidal anti-inflammatories (NSAID): Secondary | ICD-10-CM | POA: Insufficient documentation

## 2014-06-06 DIAGNOSIS — Z79899 Other long term (current) drug therapy: Secondary | ICD-10-CM | POA: Insufficient documentation

## 2014-06-06 DIAGNOSIS — N760 Acute vaginitis: Secondary | ICD-10-CM | POA: Insufficient documentation

## 2014-06-06 DIAGNOSIS — Z3202 Encounter for pregnancy test, result negative: Secondary | ICD-10-CM | POA: Diagnosis not present

## 2014-06-06 DIAGNOSIS — E119 Type 2 diabetes mellitus without complications: Secondary | ICD-10-CM | POA: Insufficient documentation

## 2014-06-06 DIAGNOSIS — I10 Essential (primary) hypertension: Secondary | ICD-10-CM | POA: Diagnosis not present

## 2014-06-06 DIAGNOSIS — T490X5A Adverse effect of local antifungal, anti-infective and anti-inflammatory drugs, initial encounter: Secondary | ICD-10-CM | POA: Diagnosis not present

## 2014-06-06 DIAGNOSIS — R102 Pelvic and perineal pain: Secondary | ICD-10-CM | POA: Diagnosis present

## 2014-06-06 LAB — PREGNANCY, URINE: PREG TEST UR: NEGATIVE

## 2014-06-06 NOTE — ED Notes (Signed)
Pt reports one week of vaginal odor - states she took Monistat tonight and immediately developed itching, burning after the treatment.

## 2014-06-07 ENCOUNTER — Emergency Department (HOSPITAL_BASED_OUTPATIENT_CLINIC_OR_DEPARTMENT_OTHER)
Admission: EM | Admit: 2014-06-07 | Discharge: 2014-06-07 | Disposition: A | Discharge status: Home / Self Care | Payer: 59 | Attending: Emergency Medicine | Admitting: Emergency Medicine

## 2014-06-07 DIAGNOSIS — T50905A Adverse effect of unspecified drugs, medicaments and biological substances, initial encounter: Secondary | ICD-10-CM

## 2014-06-07 DIAGNOSIS — B9689 Other specified bacterial agents as the cause of diseases classified elsewhere: Secondary | ICD-10-CM

## 2014-06-07 DIAGNOSIS — N760 Acute vaginitis: Secondary | ICD-10-CM

## 2014-06-07 LAB — URINALYSIS, ROUTINE W REFLEX MICROSCOPIC
Bilirubin Urine: NEGATIVE
Glucose, UA: NEGATIVE mg/dL
Ketones, ur: NEGATIVE mg/dL
Leukocytes, UA: NEGATIVE
Nitrite: NEGATIVE
Protein, ur: NEGATIVE mg/dL
Specific Gravity, Urine: 1.015 (ref 1.005–1.030)
Urobilinogen, UA: 0.2 mg/dL (ref 0.0–1.0)
pH: 5.5 (ref 5.0–8.0)

## 2014-06-07 LAB — GC/CHLAMYDIA PROBE AMP (~~LOC~~) NOT AT ARMC
CHLAMYDIA, DNA PROBE: NEGATIVE
NEISSERIA GONORRHEA: NEGATIVE

## 2014-06-07 LAB — URINE MICROSCOPIC-ADD ON

## 2014-06-07 MED ORDER — METRONIDAZOLE 500 MG PO TABS
500.0000 mg | ORAL_TABLET | Freq: Once | ORAL | Status: AC
  Administered 2014-06-07: 500 mg via ORAL
  Filled 2014-06-07: qty 1

## 2014-06-07 MED ORDER — METRONIDAZOLE 500 MG PO TABS: 500.0000 mg | ORAL_TABLET | Freq: Two times a day (BID) | ORAL | Status: DC

## 2014-06-07 NOTE — ED Provider Notes (Addendum)
CSN: [id number]     Arrival date & time 06/06/14  2331 History   First MD Initiated Contact with Patient 06/07/14 0146     Chief Complaint  Patient presents with  . Vaginal Discharge     (Consider location/radiation/quality/duration/timing/severity/associated sxs/prior Treatment) HPI  This is a 29 year old female with a one-week history of a foul vaginal odor. The symptoms are consistent with previous episodes of bacterial vaginosis. She thought that Monistat would help so she started the 7 day course of Monistat yesterday evening. She has developed moderate to severe global vaginal burning and itching as a result of using the Monistat. She has never had a reaction of Monistat in the past. She denies vaginal bleeding. She denies dysuria.  Past Medical History  Diagnosis Date  . Diabetes mellitus   . Gestational diabetes   . Hypertension    Past Surgical History  Procedure Laterality Date  . Mole removed     Family History  Problem Relation Age of Onset  . Hypertension Maternal Grandfather   . Diabetes Maternal Grandfather   . Hypertension Paternal Grandmother   . Diabetes Paternal Grandmother    History  Substance Use Topics  . Smoking status: Never Smoker   . Smokeless tobacco: Never Used  . Alcohol Use: Yes     Comment: occasionally   OB History    Gravida Para Term Preterm AB TAB SAB Ectopic Multiple Living   Review of Systems  All other systems reviewed and are negative.   Allergies  Review of patient's allergies indicates no known allergies.  Home Medications   Prior to Admission medications   Medication Sig Start Date End Date Taking? Authorizing Provider  amLODipine (NORVASC) 5 MG tablet Take 1 tablet (5 mg total) by mouth daily. 03/18/14  Yes Mirian Mo, MD  Carbonyl Iron (PERFECT IRON PO) Take 2 tablets by mouth daily.    Historical Provider, MD  cephALEXin (KEFLEX) 500 MG capsule Take 1 capsule (500 mg total) by mouth 4 (four)  times daily. 10/24/12   Marny Lowenstein, PA-C  Cholecalciferol (VITAMIN D-400 PO) Take 400 mg by mouth daily.    Historical Provider, MD  HYDROcodone-acetaminophen (NORCO/VICODIN) 5-325 MG per tablet Take 1-2 tablets by mouth every 6 (six) hours as needed for moderate pain or severe pain. 05/02/14   Hannah Muthersbaugh, PA-C  metroNIDAZOLE (FLAGYL) 500 MG tablet Take 1 tablet (500 mg total) by mouth 2 (two) times daily. 03/29/13   Gwyneth Sprout, MD  naproxen (NAPROSYN) 500 MG tablet Take 1 tablet (500 mg total) by mouth 2 (two) times daily. 05/07/13   Hope Orlene Och, NP  Naproxen Sodium (ALEVE PO) Take 2 tablets by mouth daily as needed (heacache).    Historical Provider, MD  ondansetron (ZOFRAN ODT) 4 MG disintegrating tablet  ODT q4 hours prn nausea/vomit 05/02/14   Hannah Muthersbaugh, PA-C   BP 167/105 mmHg  Pulse 67  Temp(Src) 98.5 F (36.9 C) (Oral)  Resp 18  Ht  (1.6 m)  Wt 245 lb (111.131 kg)  BMI 43.41 kg/m2  SpO2 98%   Physical Exam  General: Well-developed, well-nourished female in no acute distress; appearance consistent with age of record HENT: normocephalic; atraumatic Eyes: pupils equal, round and reactive to light; extraocular muscles intact Neck: supple Heart: regular rate and rhythm Lungs: clear to auscultation bilaterally Abdomen: soft; nondistended; nontender; no masses or hepatosplenomegaly; bowel sounds present GU: Mild vulvar irritation;  white cream in vagina; no cervical motion tenderness; no vaginal bleeding Extremities: No deformity; full range of motion; pulses normal Neurologic: Awake, alert and oriented; motor function intact in all extremities and symmetric; no facial droop Skin: Warm and dry Psychiatric: Normal mood and affect    ED Course  Procedures (including critical care time)   MDM   Nursing notes and vitals signs, including pulse oximetry, reviewed.  Summary of this visit's results, reviewed by myself:  Labs:  Results for orders  placed or performed during the hospital encounter of 06/07/14 (from the past 24 hour(s))  Urinalysis, Routine w reflex microscopic     Status: Abnormal   Collection Time: 06/06/14 11:41 PM  Result Value Ref Range   Color, Urine YELLOW YELLOW   APPearance CLOUDY (A) CLEAR   Specific Gravity, Urine 1.015 1.005 - 1.030   pH 5.5 5.0 - 8.0   Glucose, UA NEGATIVE NEGATIVE mg/dL   Hgb urine dipstick SMALL (A) NEGATIVE   Bilirubin Urine NEGATIVE NEGATIVE   Ketones, ur NEGATIVE NEGATIVE mg/dL   Protein, ur NEGATIVE NEGATIVE mg/dL   Urobilinogen, UA 0.2 0.0 - 1.0 mg/dL   Nitrite NEGATIVE NEGATIVE   Leukocytes, UA NEGATIVE NEGATIVE  Pregnancy, urine     Status: None   Collection Time: 06/06/14 11:41 PM  Result Value Ref Range   Preg Test, Ur NEGATIVE NEGATIVE  Urine microscopic-add on     Status: Abnormal   Collection Time: 06/06/14 11:41 PM  Result Value Ref Range   Squamous Epithelial / LPF FEW (A) RARE   WBC, UA 0-2 <3 WBC/hpf   RBC / HPF 0-2 <3 RBC/hpf   Bacteria, UA FEW (A) RARE   Urine-Other MUCOUS PRESENT    2:02 AM Wet prep was not performed due to profuse amount of Monistat cream in the vaginal vault. Based on the patient's history we will treat for bacterial vaginosis. She was advised to discontinue Monistat as she has likely developed a sensitivity to it. She has an appointment with her OB/GYN next week.      Paula LibraJohn Abdou Stocks, MD 06/07/14 [id number]  Paula LibraJohn Adrine Hayworth, MD 06/07/14 [phone]

## 2014-06-07 NOTE — ED Notes (Signed)
Pt reports several days of vaginal foul odor, denies vaginal discharge.  Reports thought she had infection so bought monistat otc and placed in vagina after showering and since this time with "burning" and "worsening pain".  Denies dysuria.  Reports new sexual partner recently. Pelvic cart to bedside.

## 2014-06-14 ENCOUNTER — Telehealth (HOSPITAL_BASED_OUTPATIENT_CLINIC_OR_DEPARTMENT_OTHER): Payer: Self-pay | Admitting: Emergency Medicine

## 2016-02-02 ENCOUNTER — Emergency Department (HOSPITAL_BASED_OUTPATIENT_CLINIC_OR_DEPARTMENT_OTHER): Payer: 59

## 2016-02-02 ENCOUNTER — Encounter (HOSPITAL_BASED_OUTPATIENT_CLINIC_OR_DEPARTMENT_OTHER): Payer: Self-pay | Admitting: *Deleted

## 2016-02-02 ENCOUNTER — Emergency Department (HOSPITAL_BASED_OUTPATIENT_CLINIC_OR_DEPARTMENT_OTHER)
Admission: EM | Admit: 2016-02-02 | Discharge: 2016-02-02 | Disposition: A | Discharge status: Home / Self Care | Payer: 59 | Attending: Emergency Medicine | Admitting: Emergency Medicine

## 2016-02-02 DIAGNOSIS — E119 Type 2 diabetes mellitus without complications: Secondary | ICD-10-CM | POA: Diagnosis not present

## 2016-02-02 DIAGNOSIS — N898 Other specified noninflammatory disorders of vagina: Secondary | ICD-10-CM | POA: Diagnosis not present

## 2016-02-02 DIAGNOSIS — R102 Pelvic and perineal pain: Secondary | ICD-10-CM | POA: Diagnosis present

## 2016-02-02 DIAGNOSIS — M79671 Pain in right foot: Secondary | ICD-10-CM | POA: Insufficient documentation

## 2016-02-02 DIAGNOSIS — Z79899 Other long term (current) drug therapy: Secondary | ICD-10-CM | POA: Insufficient documentation

## 2016-02-02 DIAGNOSIS — I10 Essential (primary) hypertension: Secondary | ICD-10-CM | POA: Insufficient documentation

## 2016-02-02 LAB — URINALYSIS, ROUTINE W REFLEX MICROSCOPIC
Bilirubin Urine: NEGATIVE
Glucose, UA: NEGATIVE mg/dL
HGB URINE DIPSTICK: NEGATIVE
Ketones, ur: NEGATIVE mg/dL
LEUKOCYTES UA: NEGATIVE
Nitrite: NEGATIVE
PROTEIN: NEGATIVE mg/dL
Specific Gravity, Urine: 1.01 (ref 1.005–1.030)
pH: 7.5 (ref 5.0–8.0)

## 2016-02-02 LAB — WET PREP, GENITAL
Sperm: NONE SEEN
Trich, Wet Prep: NONE SEEN
Yeast Wet Prep HPF POC: NONE SEEN

## 2016-02-02 LAB — PREGNANCY, URINE: Preg Test, Ur: NEGATIVE

## 2016-02-02 NOTE — ED Provider Notes (Signed)
[facility] Provider Note   CSN: [id number] Arrival date & time: 02/02/16  1021     History   Chief Complaint Chief Complaint  Patient presents with  . Pelvic Pain  . Foot Pain    HPI Madeline Cooke is a 30 y.o. female.  HPI   30 year old female resents today with pelvic pain and foot pain. Patient reports one month history of right foot pain progressively worsened. She reports pain is progressively gotten worse and, worse with ambulation. She reports it "feels swollen", but does not appear to be swollen. Patient notes is located over the mid foot, she denies any trauma to the foot, redness, warmth to touch. Patient has tried warm water soaks, ice with no improvement in symptoms.  Patient also reports pelvic pain. She notes a history of IUD insertion several years ago that caused significant pelvic pain, after removal of IUD symptoms went away. She notes losing a significant amount of weight and thought that that was the cause of the failed IUD treatment. Patient had an IUD placed back in August, noted similar pain developing over the last day. She notes it's not as severe as last time, but wanted to make sure she caught the pain early. She notes very light periods for the first several months, has not had her menstrual cycle yet as her last normal menstrual cycle was on October 17 and 18th. She denies any bleeding, denies any vaginal discharge, denies any painful intercourse, upper abdominal pain, vomiting, fever, or any other concerning symptoms. Patient describes the pain is sharp in the left pelvic region. She denies taking any pain medication for this discomfort. Patient reports she cannot feel her IUD strings.    Past Medical History:  Diagnosis Date  . Diabetes mellitus   . Gestational diabetes   . Hypertension     There are no active problems to display for this patient.   Past Surgical History:  Procedure Laterality Date  . mole removed      OB History     Gravida Para Term Preterm AB Living   5 4     1 4    SAB TAB Ectopic Multiple Live Births     1     4       Home Medications    Prior to Admission medications   Medication Sig Start Date End Date Taking? Authorizing Provider  phentermine 37.5 MG capsule Take 37.5 mg by mouth every morning.   Yes Historical Provider, MD  amLODipine (NORVASC) 5 MG tablet Take 1 tablet (5 mg total) by mouth daily. 03/18/14   Mirian MoMatthew Gentry, MD  metroNIDAZOLE (FLAGYL) 500 MG tablet Take 1 tablet (500 mg total) by mouth 2 (two) times daily. One po bid x 7 days 06/07/14   Paula LibraJohn Molpus, MD    Family History Family History  Problem Relation Age of Onset  . Hypertension Maternal Grandfather   . Diabetes Maternal Grandfather   . Hypertension Paternal Grandmother   . Diabetes Paternal Grandmother     Social History Social History  Substance Use Topics  . Smoking status: Never Smoker  . Smokeless tobacco: Never Used  . Alcohol use Yes     Comment: occasionally     Allergies   Patient has no known allergies.   Review of Systems Review of Systems  All other systems reviewed and are negative.    Physical Exam Updated Vital Signs BP 155/91 (BP Location: Right Arm)   Pulse 67   Temp  98.6 F (37 C) (Oral)   Resp 20   Ht 5\' 3"  (1.6 m)   Wt 59.4 kg   LMP 01/07/2016   SpO2 99%   BMI 23.21 kg/m   Physical Exam  Constitutional: She is oriented to person, place, and time. She appears well-developed and well-nourished.  HENT:  Head: Normocephalic and atraumatic.  Eyes: Conjunctivae are normal. Pupils are equal, round, and reactive to light. Right eye exhibits no discharge. Left eye exhibits no discharge. No scleral icterus.  Neck: Normal range of motion. No JVD present. No tracheal deviation present.  Pulmonary/Chest: Effort normal. No stridor.  Genitourinary: Cervix exhibits no discharge and no friability. Right adnexum displays no mass, no tenderness and no fullness. Left adnexum displays  no mass, no tenderness and no fullness. Vaginal discharge found.  Musculoskeletal:  Right foot with no signs of swelling, redness, warmth to touch. Tenderness to palpation of the dorsal midfoot, ankle nontender full active range of motion  Neurological: She is alert and oriented to person, place, and time. Coordination normal.  Psychiatric: She has a normal mood and affect. Her behavior is normal. Judgment and thought content normal.  Nursing note and vitals reviewed.    ED Treatments / Results  Labs (all labs ordered are listed, but only abnormal results are displayed) Labs Reviewed  WET PREP, GENITAL - Abnormal; Notable for the following:       Result Value   Clue Cells Wet Prep HPF POC PRESENT (*)    WBC, Wet Prep HPF POC MODERATE (*)    All other components within normal limits  URINALYSIS, ROUTINE W REFLEX MICROSCOPIC (NOT AT [facility])  PREGNANCY, URINE  GC/CHLAMYDIA PROBE AMP () NOT AT [facility]    EKG  EKG Interpretation None       Radiology US Transvaginal Non-ob  Result Date: 02/02/2016 CLINICAL DATA:  Patient with pelvic pain for 1 day. Recent placement of intrauterine device. Negative pregnancy test. EXAM: TRANSABDOMINAL AND TRANSVAGINAL ULTRASOUND OF PELVIS DOPPLER ULTRASOUND OF OVARIES TECHNIQUE: Both transabdominal and transvaginal ultrasound examinations of the pelvis were performed. Transabdominal technique was performed for global imaging of the pelvis including uterus, ovaries, adnexal regions, and pelvic cul-de-sac. It was necessary to proceed with endovaginal exam following the transabdominal exam to visualize the endometrium and adnexal structures. Color and duplex Doppler ultrasound was utilized to evaluate blood flow to the ovaries. COMPARISON:  None. FINDINGS: Uterus Measurements: 12.4 x 4.9 x 5.8 cm. No fibroids or other mass visualized. Endometrium Thickness: 6 mm. Intrauterine device is poorly visualized however does appear to be located appropriately.  Right ovary Measurements: 3.6 x 2.0 x 2.8 cm. Normal appearance/no adnexal mass. Left ovary Measurements: 2.6 x 2.9 x 2.7 cm. Normal appearance/no adnexal mass. Pulsed Doppler evaluation of both ovaries demonstrates normal low-resistance arterial and venous waveforms. Other findings No abnormal free fluid. IMPRESSION: No acute process within the pelvis. Normal sonographic appearance of the ovaries bilaterally. Electronically Signed   By: Annia Belt M.D.   On: 02/02/2016 13:30   US Pelvis Complete  Result Date: 02/02/2016 CLINICAL DATA:  Patient with pelvic pain for 1 day. Recent placement of intrauterine device. Negative pregnancy test. EXAM: TRANSABDOMINAL AND TRANSVAGINAL ULTRASOUND OF PELVIS DOPPLER ULTRASOUND OF OVARIES TECHNIQUE: Both transabdominal and transvaginal ultrasound examinations of the pelvis were performed. Transabdominal technique was performed for global imaging of the pelvis including uterus, ovaries, adnexal regions, and pelvic cul-de-sac. It was necessary to proceed with endovaginal exam following the transabdominal exam to visualize the endometrium  and adnexal structures. Color and duplex Doppler ultrasound was utilized to evaluate blood flow to the ovaries. COMPARISON:  None. FINDINGS: Uterus Measurements: 12.4 x 4.9 x 5.8 cm. No fibroids or other mass visualized. Endometrium Thickness: 6 mm. Intrauterine device is poorly visualized however does appear to be located appropriately. Right ovary Measurements: 3.6 x 2.0 x 2.8 cm. Normal appearance/no adnexal mass. Left ovary Measurements: 2.6 x 2.9 x 2.7 cm. Normal appearance/no adnexal mass. Pulsed Doppler evaluation of both ovaries demonstrates normal low-resistance arterial and venous waveforms. Other findings No abnormal free fluid. IMPRESSION: No acute process within the pelvis. Normal sonographic appearance of the ovaries bilaterally. Electronically Signed   By: Annia Beltrew  Davis M.D.   On: 02/02/2016 13:30   [place] Art/ven Flow Abd Pelv  Doppler  Result Date: 02/02/2016 CLINICAL DATA:  Patient with pelvic pain for 1 day. Recent placement of intrauterine device. Negative pregnancy test. EXAM: TRANSABDOMINAL AND TRANSVAGINAL ULTRASOUND OF PELVIS DOPPLER ULTRASOUND OF OVARIES TECHNIQUE: Both transabdominal and transvaginal ultrasound examinations of the pelvis were performed. Transabdominal technique was performed for global imaging of the pelvis including uterus, ovaries, adnexal regions, and pelvic cul-de-sac. It was necessary to proceed with endovaginal exam following the transabdominal exam to visualize the endometrium and adnexal structures. Color and duplex Doppler ultrasound was utilized to evaluate blood flow to the ovaries. COMPARISON:  None. FINDINGS: Uterus Measurements: 12.4 x 4.9 x 5.8 cm. No fibroids or other mass visualized. Endometrium Thickness: 6 mm. Intrauterine device is poorly visualized however does appear to be located appropriately. Right ovary Measurements: 3.6 x 2.0 x 2.8 cm. Normal appearance/no adnexal mass. Left ovary Measurements: 2.6 x 2.9 x 2.7 cm. Normal appearance/no adnexal mass. Pulsed Doppler evaluation of both ovaries demonstrates normal low-resistance arterial and venous waveforms. Other findings No abnormal free fluid. IMPRESSION: No acute process within the pelvis. Normal sonographic appearance of the ovaries bilaterally. Electronically Signed   By: Annia Beltrew  Davis M.D.   On: 02/02/2016 13:30   Dg Foot Complete Right  Result Date: 02/02/2016 CLINICAL DATA:  Dorsal midfoot pain and swelling for 1 month worsening over the last week. No history of injury. EXAM: RIGHT FOOT COMPLETE - 3+ VIEW COMPARISON:  None. FINDINGS: There is no evidence of fracture or dislocation. There is no evidence of arthropathy or other focal bone abnormality. Soft tissues are unremarkable. IMPRESSION: Negative. Electronically Signed   By: Kennith CenterEric  Mansell M.D.   On: 02/02/2016 11:28    Procedures Procedures (including critical care  time)  Medications Ordered in ED Medications - No data to display   Initial Impression / Assessment and Plan / ED Course  I have reviewed the triage vital signs and the nursing notes.  Pertinent labs & imaging results that were available during my care of the patient were reviewed by me and considered in my medical decision making (see chart for details).  Clinical Course      Final Clinical Impressions(s) / ED Diagnoses   Final diagnoses:  Pelvic pain  Right foot pain    Labs:  Imaging:  Consults:  Therapeutics:  Discharge Meds:   Assessment/Plan:  30 year old female presents today with foot pain. This is likely overuse, no signs of swelling edema, infectious etiology. Patient was given a postop shoe. No upper with Dr. Pearletha ForgeHudnall of sports medicine for further evaluation and management.  Patient also having pelvic pain. She attributes this to her IUD, reports this is that previous. Discussion of ultrasound versus OB/GYN follow-up, patient would like to proceed with ultrasound to  verify placement of IUD and rule out any other significant potential causes. Her ultrasound came back showing no significant findings, with likely placement of IUD appropriate. I was unable to visualize strings on my exam. Patient will have close follow-up with OB/GYN for reevaluation and further management. She is given strict impressions, she verbalized understanding and agreement to today's plan.      New Prescriptions New Prescriptions   No medications on file     Eyvonne Mechanic, PA-C 02/02/16 1357    Eyvonne Mechanic, PA-C 02/02/16 1359    Laurence Spates, MD 02/02/16 1459

## 2016-02-02 NOTE — Discharge Instructions (Signed)
Please read attached information. If you experience any new or worsening signs or symptoms please return to the emergency room for evaluation. Please follow-up with your primary care provider or specialist as discussed. Please use medication prescribed only as directed and discontinue taking if you have any concerning signs or symptoms.   °

## 2016-02-02 NOTE — ED Triage Notes (Signed)
Patient c/o R foot pain that has grown worse over the past week, hurt to bear weight  Or move foot, no injury. She also c/o pelvic pain since yesterday. She states that she had her IUD placed in august and she feels the pain is caused by the IUD.

## 2016-02-03 LAB — GC/CHLAMYDIA PROBE AMP (~~LOC~~) NOT AT ARMC
Chlamydia: NEGATIVE
Neisseria Gonorrhea: NEGATIVE

## 2016-02-07 ENCOUNTER — Encounter: Payer: Self-pay | Admitting: *Deleted

## 2016-02-07 ENCOUNTER — Emergency Department (INDEPENDENT_AMBULATORY_CARE_PROVIDER_SITE_OTHER)
Admission: EM | Admit: 2016-02-07 | Discharge: 2016-02-07 | Disposition: A | Discharge status: Home / Self Care | Payer: 59 | Source: Home / Self Care | Attending: Family Medicine | Admitting: Family Medicine

## 2016-02-07 DIAGNOSIS — L03012 Cellulitis of left finger: Secondary | ICD-10-CM | POA: Diagnosis not present

## 2016-02-07 DIAGNOSIS — R030 Elevated blood-pressure reading, without diagnosis of hypertension: Secondary | ICD-10-CM

## 2016-02-07 MED ORDER — MUPIROCIN 2 % EX OINT: TOPICAL_OINTMENT | CUTANEOUS | 0 refills | Status: DC

## 2016-02-07 NOTE — ED Provider Notes (Signed)
CSN: [id number]     Arrival date & time 02/07/16  [id number] History   First MD Initiated Contact with Patient 02/07/16 0845     Chief Complaint  Patient presents with  . Abscess   (Consider location/radiation/quality/duration/timing/severity/associated sxs/prior Treatment) HPI  Madeline Cooke is a 30 y.o. female presenting to UC with c/o gradually worsening Left middle finger pain and swelling that started about 4 days ago.  She was see at [facility] 3 days ago and started on Bactrim BID but has not had any improvement.  Today, pain is even worse than before. Aching and throbbing, 10/10, worse with any pressure to her finger. She notes symptoms started after she got her nails done about 1 week ago.  Denies any bleeding or discharge from the skin or her nail.    Past Medical History:  Diagnosis Date  . Diabetes mellitus   . Gestational diabetes   . Hypertension    Past Surgical History:  Procedure Laterality Date  . mole removed     Family History  Problem Relation Age of Onset  . Hypertension Maternal Grandfather   . Diabetes Maternal Grandfather   . Hypertension Paternal Grandmother   . Diabetes Paternal Grandmother    Social History  Substance Use Topics  . Smoking status: Never Smoker  . Smokeless tobacco: Never Used  . Alcohol use Yes     Comment: occasionally   OB History    Gravida Para Term Preterm AB Living   5 4     1 4    SAB TAB Ectopic Multiple Live Births     1     4     Review of Systems  Constitutional: Negative for chills and fever.  Gastrointestinal: Positive for nausea. Negative for diarrhea and vomiting.  Musculoskeletal: Positive for arthralgias and joint swelling. Negative for myalgias.       Distal Left middle finger  Skin: Positive for color change. Negative for wound.  Neurological: Negative for weakness and numbness.    Allergies  Patient has no known allergies.  Home Medications   Prior to Admission medications   Medication Sig Start Date  End Date Taking? Authorizing Provider  amLODipine (NORVASC) 5 MG tablet Take 1 tablet (5 mg total) by mouth daily. 03/18/14  Yes Mirian MoMatthew Gentry, MD  phentermine 37.5 MG capsule Take 37.5 mg by mouth every morning.   Yes Historical Provider, MD  mupirocin ointment (BACTROBAN) 2 % Apply to finger 3 times daily for 5 days 02/07/16   Junius FinnerErin O'Malley, PA-C   Meds Ordered and Administered this Visit  Medications - No data to display  BP (!) 165/113 (BP Location: Right Arm)   Pulse 89   Temp 98.3 F (36.8 C) (Oral)   Resp 16   Ht 5\' 3"  (1.6 m)   Wt 233 lb (105.7 kg)   LMP 01/07/2016   SpO2 100%   BMI 41.27 kg/m  No data found.   Physical Exam  Constitutional: She is oriented to person, place, and time. She appears well-developed and well-nourished. No distress.  HENT:  Head: Normocephalic and atraumatic.  Eyes: EOM are normal.  Neck: Normal range of motion.  Cardiovascular: Normal rate.   Pulmonary/Chest: Effort normal.  Musculoskeletal: Normal range of motion. She exhibits edema and tenderness.  Left middle finger, distal aspect: Mild edema, full ROM. Tenderness along nail boarder. No tenderness to volar aspect of finger.  Neurological: She is alert and oriented to person, place, and time.  Skin: Skin is warm  and dry. Capillary refill takes less than 2 seconds. She is not diaphoretic. There is erythema.  Left middle finger, distal aspect. Mild edema with fluctuance along nailbed. Erythema. No active bleeding or drainage.   Psychiatric: She has a normal mood and affect. Her behavior is normal.  Nursing note and vitals reviewed.   Urgent Care Course   Clinical Course     .Marland Kitchen.Incision and Drainage Date/Time: 02/07/2016 9:14 AM Performed by: Junius Finner'MALLEY, Kailynne Ferrington Authorized by: Donna ChristenBEESE, STEPHEN A   Consent:    Consent obtained:  Verbal   Consent given by:  Patient   Risks discussed:  Bleeding, infection, incomplete drainage and pain   Alternatives discussed:  No treatment and delayed  treatment Location:    Type:  Abscess   Size:  0.5   Location:  Upper extremity   Upper extremity location:  Finger   Finger location:  L long finger Pre-procedure details:    Skin preparation:  Antiseptic wash Anesthesia (see MAR for exact dosages):    Anesthesia method:  Topical application   Topical anesthesia: Pain ease topical anesthetic mist spray. Procedure type:    Complexity:  Simple Procedure details:    Needle aspiration: no     Incision types:  Single straight   Incision depth:  Subcutaneous   Scalpel blade:  11   Techniques: gentle massage of area to break up loculations and drain discharge.   Drainage:  Bloody and purulent   Drainage amount:  Scant   Wound treatment:  Wound left open (bacitracin and bandage applied)   Packing materials:  None Post-procedure details:    Patient tolerance of procedure:  Tolerated well, no immediate complications   (including critical care time)  Labs Review Labs Reviewed - No data to display  Imaging Review No results found.    MDM   1. Paronychia of left middle finger   2. Elevated blood pressure reading    Pt c/o gradually worsening pain and swelling of Left middle finger. Exam c/w paronychia.   I&D performed w/o immediate complication.  Rx: Mupirocin ointment. May continue PO bactrim. Home care instructions provided. F/u in 3-4 days if needed.  BP was elevated in triage and with recheck. Hx of HTN. Has not taken her BP medication this morning. She also notes she is in pain with her finger. Denies chest pain, headache or dizziness. Encouraged to f/u with PCP and monitor her BP.   Junius FinnerErin O'Malley, PA-C 02/07/16 0913    Junius FinnerErin O'Malley, PA-C 02/07/16 [phone]

## 2016-02-07 NOTE — ED Triage Notes (Signed)
Pt c/o LT 3rd finger infected x 4 days. She went to [facility] 3 days ago and was given Bactrim DS 1 po BID with no improvement.

## 2017-08-20 ENCOUNTER — Other Ambulatory Visit: Payer: Self-pay

## 2017-08-20 ENCOUNTER — Encounter: Payer: Self-pay | Admitting: Emergency Medicine

## 2017-08-20 ENCOUNTER — Emergency Department (INDEPENDENT_AMBULATORY_CARE_PROVIDER_SITE_OTHER)
Admission: EM | Admit: 2017-08-20 | Discharge: 2017-08-20 | Disposition: A | Discharge status: Home / Self Care | Payer: 59 | Source: Home / Self Care | Attending: Family Medicine | Admitting: Family Medicine

## 2017-08-20 DIAGNOSIS — R030 Elevated blood-pressure reading, without diagnosis of hypertension: Secondary | ICD-10-CM

## 2017-08-20 DIAGNOSIS — N898 Other specified noninflammatory disorders of vagina: Secondary | ICD-10-CM | POA: Diagnosis not present

## 2017-08-20 DIAGNOSIS — Z711 Person with feared health complaint in whom no diagnosis is made: Secondary | ICD-10-CM

## 2017-08-20 NOTE — ED Triage Notes (Signed)
Vaginal discharge yellow in color, foul odor, lower abdominal pain, x 2-3 days, had unprotected sex one week ago.

## 2017-08-20 NOTE — ED Provider Notes (Signed)
[facility] Co[facility]    CSN: [id number] Arrival date & time: 08/20/17  1655     History   Chief Complaint Chief Complaint  Patient presents with  . Vaginal Discharge    HPI Madeline Cooke is a 32 y.o. female.   HPI Madeline Cooke is a 32 y.o. female presenting to UC with c/o lower abdominal pain for about 2-3 days, worse on Left side, cramping and occasionally sharp. Associated thin clear to yellow discharge. She had unprotected intercourse about 1 week ago and is concerned for STDs but denies specific known exposure.  Denies fever, chills, n/v/d. LMP: 08/05/17. Denies urinary symptoms.   BP elevated in triage. Pt denies HA, dizziness or chest pain. Hx of HTN. She takes amlodipine and she did take it today but she has been off of it for a few days.    Past Medical History:  Diagnosis Date  . Diabetes mellitus   . Gestational diabetes   . Hypertension     There are no active problems to display for this patient.   Past Surgical History:  Procedure Laterality Date  . mole removed      OB History    Gravida  5   Para  4   Term      Preterm      AB  1   Living  4     SAB      TAB  1   Ectopic      Multiple      Live Births  4            Home Medications    Prior to Admission medications   Medication Sig Start Date End Date Taking? Authorizing Provider  fluticasone (FLONASE) 50 MCG/ACT nasal spray Place into both nostrils daily.   Yes [provider]  amLODipine (NORVASC) 5 MG tablet Take 1 tablet (5 mg total) by mouth daily. 03/18/14   Mirian Mo, MD  Carbonyl Iron (PERFECT IRON PO) Take 2 tablets by mouth daily.  06/07/14  [provider]    Family History Family History  Problem Relation Age of Onset  . Hypertension Maternal Grandfather   . Diabetes Maternal Grandfather   . Hypertension Paternal Grandmother   . Diabetes Paternal Grandmother     Social History Social History   Tobacco Use  . Smoking  status: Never Smoker  . Smokeless tobacco: Never Used  Substance Use Topics  . Alcohol use: Yes    Comment: occasionally  . Drug use: No     Allergies   Patient has no known allergies.   Review of Systems Review of Systems  Constitutional: Negative for chills and fever.  Cardiovascular: Negative for chest pain and palpitations.  Gastrointestinal: Positive for abdominal pain. Negative for diarrhea, nausea and vomiting.  Genitourinary: Positive for pelvic pain and vaginal discharge. Negative for decreased urine volume, dysuria, frequency, genital sores, hematuria, urgency, vaginal bleeding and vaginal pain.  Musculoskeletal: Negative for back pain and myalgias.  Skin: Negative for rash.  Neurological: Negative for dizziness, light-headedness and headaches.     Physical Exam Triage Vital Signs ED Triage Vitals  Enc Vitals Group     BP 08/20/17 1723 (!) 162/113     Pulse Rate 08/20/17 1723 74     Resp --      Temp 08/20/17 1723 98.6 F (37 C)     Temp Source 08/20/17 1723 Oral     SpO2 08/20/17 1723 100 %  Weight 08/20/17 1726 225 lb (102.1 kg)     Height 08/20/17 1726  (1.6 m)     Head Circumference --      Peak Flow --      Pain Score 08/20/17 1725 6     Pain Loc --      Pain Edu? --      Excl. in GC? --    No data found.  Updated Vital Signs BP (!) 162/114 (BP Location: Right Arm)   Pulse 74   Temp 98.6 F (37 C) (Oral)   Ht  (1.6 m)   Wt 225 lb (102.1 kg)   LMP 08/05/2017   SpO2 100%   BMI 39.86 kg/m   Visual Acuity Right Eye Distance:   Left Eye Distance:   Bilateral Distance:    Right Eye Near:   Left Eye Near:    Bilateral Near:     Physical Exam  Constitutional: She is oriented to person, place, and time. She appears well-developed and well-nourished. No distress.  HENT:  Head: Normocephalic and atraumatic.  Mouth/Throat: Oropharynx is clear and moist.  Eyes: EOM are normal.  Neck: Normal range of motion.  Cardiovascular:  Normal rate and regular rhythm.  Pulmonary/Chest: Effort normal and breath sounds normal. No stridor. No respiratory distress. She has no wheezes. She has no rales.  Abdominal: Soft. She exhibits no distension. There is no tenderness. There is no CVA tenderness.  Genitourinary:  Genitourinary Comments: Chaperoned exam. Normal external exam. Vaginal canal- small amount of thin white-to yellow discharge. Scant red blood after GC swab.  No CMT, adnexal tenderness or masses   Musculoskeletal: Normal range of motion.  Neurological: She is alert and oriented to person, place, and time.  Skin: Skin is warm and dry. She is not diaphoretic.  Psychiatric: She has a normal mood and affect. Her behavior is normal.  Nursing note and vitals reviewed.    UC Treatments / Results  Labs (all labs ordered are listed, but only abnormal results are displayed) Labs Reviewed  WET PREP BY MOLECULAR PROBE  GC/CHLAMYDIA PROBE AMP  HIV ANTIBODY (ROUTINE TESTING)  RPR    EKG None  Radiology No results found.  Procedures Procedures (including critical care time)  Medications Ordered in UC Medications - No data to display  Initial Impression / Assessment and Plan / UC Course  I have reviewed the triage vital signs and the nursing notes.  Pertinent labs & imaging results that were available during my care of the patient were reviewed by me and considered in my medical decision making (see chart for details).     Swabs and blood sent to lab Pt will be notified of results as they come in. Home care instructions provided below.   Final Clinical Impressions(s) / UC Diagnoses   Final diagnoses:  Vaginal discharge  Concern about STD in female without diagnosis  Elevated blood pressure reading     Discharge Instructions      Please refrain from sexual activity until your lab results come back.  If you need any treatment, please refrain from sexual intercourse for 7 days. Be sure to have all  partners tested and treated for STDs.  Practice safe sex by always using condoms.   Please follow up with your OB/GYN for recheck of vaginal symptoms if not improving in 1 week and please follow up with family medicine for recheck of your blood pressure.     ED Prescriptions    None  Controlled Substance Prescriptions [place] Controlled Substance Registry consulted? Not Applicable   Rolla Plate 08/20/17 1757

## 2017-08-20 NOTE — Discharge Instructions (Signed)
°  Please refrain from sexual activity until your lab results come back.  If you need any treatment, please refrain from sexual intercourse for 7 days. Be sure to have all partners tested and treated for STDs.  Practice safe sex by always using condoms.   Please follow up with your OB/GYN for recheck of vaginal symptoms if not improving in 1 week and please follow up with family medicine for recheck of your blood pressure.

## 2017-08-21 ENCOUNTER — Telehealth: Payer: Self-pay | Admitting: Emergency Medicine

## 2017-08-21 LAB — C. TRACHOMATIS/N. GONORRHOEAE RNA
C. trachomatis RNA, TMA: NOT DETECTED
N. gonorrhoeae RNA, TMA: NOT DETECTED

## 2017-08-21 LAB — WET PREP BY MOLECULAR PROBE
Candida species: NOT DETECTED
MICRO NUMBER:: 90659149
SPECIMEN QUALITY:: ADEQUATE
Trichomonas vaginosis: NOT DETECTED

## 2017-08-21 MED ORDER — METRONIDAZOLE 500 MG PO TABS: 500.0000 mg | ORAL_TABLET | Freq: Two times a day (BID) | ORAL | 0 refills | Status: DC

## 2017-08-21 NOTE — Telephone Encounter (Signed)
Patient informed of wet prep results.  Informed that she does have bacterial vaginosis.  Patient voices understanding as she does have a history of this.  [facility] [address] [place].  Will follow up as scheduled.

## 2017-08-21 NOTE — Telephone Encounter (Signed)
Patient informed of negative GC/Chlamydia results.  Patient voices understanding.

## 2017-08-23 ENCOUNTER — Telehealth: Payer: Self-pay

## 2017-08-23 LAB — EXTRA LAV TOP TUBE

## 2017-08-23 LAB — HIV ANTIBODY (ROUTINE TESTING W REFLEX): HIV 1&2 Ab, 4th Generation: NONREACTIVE

## 2017-08-23 LAB — RPR: RPR Ser Ql: NONREACTIVE

## 2017-08-23 NOTE — Telephone Encounter (Signed)
Notified patient of last lab result and that medication was called in to pharmacy.

## 2017-08-23 NOTE — Telephone Encounter (Signed)
P wanted medication to be called into the [facility], [place].  Called at 9:30 am and left on VM,.

## 2018-03-23 NOTE — L&D Delivery Note (Signed)
Delivery Note At 9:26 AM a viable and healthy female was delivered via Vaginal, Spontaneous (Presentation: vtx;  OA).  APGAR: 7, 9; weight  6lb 4 oz  Placenta status: spontaneous intact, .  Cord:;long, loop next to abdomen  with the following complications: .none  Cord pH: none  Anesthesia: epidural, local   Episiotomy: None Lacerations: None Suture Repair: n/a Est. Blood Loss (mL): 142  Mom to postpartum.  Baby to Couplet care / Skin to Skin.  Jasilyn Holderman A Yatzari Jonsson 12/27/2018, 9:49 AM

## 2018-06-29 LAB — OB RESULTS CONSOLE ABO/RH: RH Type: POSITIVE

## 2018-06-29 LAB — OB RESULTS CONSOLE HEPATITIS B SURFACE ANTIGEN: Hepatitis B Surface Ag: NEGATIVE

## 2018-06-29 LAB — OB RESULTS CONSOLE RUBELLA ANTIBODY, IGM: Rubella: IMMUNE

## 2018-06-29 LAB — OB RESULTS CONSOLE GC/CHLAMYDIA
Chlamydia: NEGATIVE
Gonorrhea: NEGATIVE

## 2018-06-29 LAB — OB RESULTS CONSOLE ANTIBODY SCREEN: Antibody Screen: NEGATIVE

## 2018-06-29 LAB — OB RESULTS CONSOLE HIV ANTIBODY (ROUTINE TESTING): HIV: NONREACTIVE

## 2018-06-29 LAB — OB RESULTS CONSOLE RPR: RPR: NONREACTIVE

## 2018-07-08 ENCOUNTER — Other Ambulatory Visit: Payer: Self-pay

## 2018-07-25 ENCOUNTER — Other Ambulatory Visit (HOSPITAL_COMMUNITY): Payer: Self-pay | Admitting: Obstetrics and Gynecology

## 2018-07-25 DIAGNOSIS — O280 Abnormal hematological finding on antenatal screening of mother: Secondary | ICD-10-CM

## 2018-07-25 DIAGNOSIS — O09522 Supervision of elderly multigravida, second trimester: Secondary | ICD-10-CM

## 2018-07-26 ENCOUNTER — Encounter (HOSPITAL_COMMUNITY): Payer: Self-pay | Admitting: *Deleted

## 2018-07-27 ENCOUNTER — Other Ambulatory Visit (HOSPITAL_COMMUNITY): Payer: Self-pay | Admitting: *Deleted

## 2018-07-27 ENCOUNTER — Other Ambulatory Visit: Payer: Self-pay

## 2018-07-27 ENCOUNTER — Other Ambulatory Visit (HOSPITAL_COMMUNITY): Payer: Self-pay | Admitting: Obstetrics and Gynecology

## 2018-07-27 ENCOUNTER — Ambulatory Visit (HOSPITAL_COMMUNITY): Payer: 59

## 2018-07-27 ENCOUNTER — Ambulatory Visit (HOSPITAL_COMMUNITY): Payer: 59 | Admitting: *Deleted

## 2018-07-27 ENCOUNTER — Ambulatory Visit (HOSPITAL_COMMUNITY)
Admission: RE | Admit: 2018-07-27 | Discharge: 2018-07-27 | Disposition: A | Discharge status: Home / Self Care | Payer: 59 | Source: Ambulatory Visit | Attending: Obstetrics and Gynecology | Admitting: Obstetrics and Gynecology

## 2018-07-27 ENCOUNTER — Encounter (HOSPITAL_COMMUNITY): Payer: Self-pay

## 2018-07-27 VITALS — BP 139/94 | HR 92 | Temp 98.8°F | Wt 244.6 lb

## 2018-07-27 DIAGNOSIS — O285 Abnormal chromosomal and genetic finding on antenatal screening of mother: Secondary | ICD-10-CM

## 2018-07-27 DIAGNOSIS — O10919 Unspecified pre-existing hypertension complicating pregnancy, unspecified trimester: Secondary | ICD-10-CM | POA: Insufficient documentation

## 2018-07-27 DIAGNOSIS — Z363 Encounter for antenatal screening for malformations: Secondary | ICD-10-CM | POA: Diagnosis not present

## 2018-07-27 DIAGNOSIS — O99212 Obesity complicating pregnancy, second trimester: Secondary | ICD-10-CM

## 2018-07-27 DIAGNOSIS — O09522 Supervision of elderly multigravida, second trimester: Secondary | ICD-10-CM

## 2018-07-27 DIAGNOSIS — Z3A15 15 weeks gestation of pregnancy: Secondary | ICD-10-CM

## 2018-07-27 DIAGNOSIS — O280 Abnormal hematological finding on antenatal screening of mother: Secondary | ICD-10-CM

## 2018-07-27 DIAGNOSIS — O10012 Pre-existing essential hypertension complicating pregnancy, second trimester: Secondary | ICD-10-CM

## 2018-07-27 DIAGNOSIS — O289 Unspecified abnormal findings on antenatal screening of mother: Secondary | ICD-10-CM

## 2018-07-27 DIAGNOSIS — Z862 Personal history of diseases of the blood and blood-forming organs and certain disorders involving the immune mechanism: Secondary | ICD-10-CM

## 2018-08-05 ENCOUNTER — Other Ambulatory Visit: Payer: Self-pay | Admitting: Obstetrics and Gynecology

## 2018-08-05 DIAGNOSIS — N6320 Unspecified lump in the left breast, unspecified quadrant: Secondary | ICD-10-CM

## 2018-08-11 ENCOUNTER — Ambulatory Visit (HOSPITAL_COMMUNITY): Payer: Self-pay | Admitting: Obstetrics and Gynecology

## 2018-08-24 ENCOUNTER — Other Ambulatory Visit (HOSPITAL_COMMUNITY): Payer: Self-pay | Admitting: *Deleted

## 2018-08-24 ENCOUNTER — Encounter (HOSPITAL_COMMUNITY): Payer: Self-pay

## 2018-08-24 ENCOUNTER — Ambulatory Visit (HOSPITAL_COMMUNITY)
Admission: RE | Admit: 2018-08-24 | Discharge: 2018-08-24 | Disposition: A | Discharge status: Home / Self Care | Payer: 59 | Source: Ambulatory Visit | Attending: Maternal & Fetal Medicine | Admitting: Maternal & Fetal Medicine

## 2018-08-24 ENCOUNTER — Other Ambulatory Visit: Payer: Self-pay | Admitting: Obstetrics and Gynecology

## 2018-08-24 ENCOUNTER — Other Ambulatory Visit: Payer: Self-pay

## 2018-08-24 ENCOUNTER — Ambulatory Visit
Admission: RE | Admit: 2018-08-24 | Discharge: 2018-08-24 | Disposition: A | Discharge status: Home / Self Care | Payer: 59 | Source: Ambulatory Visit | Attending: Obstetrics and Gynecology | Admitting: Obstetrics and Gynecology

## 2018-08-24 ENCOUNTER — Ambulatory Visit (HOSPITAL_COMMUNITY): Payer: 59 | Admitting: *Deleted

## 2018-08-24 VITALS — BP 145/89 | HR 82 | Temp 98.8°F

## 2018-08-24 DIAGNOSIS — O10919 Unspecified pre-existing hypertension complicating pregnancy, unspecified trimester: Secondary | ICD-10-CM

## 2018-08-24 DIAGNOSIS — O280 Abnormal hematological finding on antenatal screening of mother: Secondary | ICD-10-CM

## 2018-08-24 DIAGNOSIS — Z363 Encounter for antenatal screening for malformations: Secondary | ICD-10-CM

## 2018-08-24 DIAGNOSIS — N6320 Unspecified lump in the left breast, unspecified quadrant: Secondary | ICD-10-CM

## 2018-08-24 DIAGNOSIS — O09522 Supervision of elderly multigravida, second trimester: Secondary | ICD-10-CM

## 2018-08-24 DIAGNOSIS — O99212 Obesity complicating pregnancy, second trimester: Secondary | ICD-10-CM | POA: Diagnosis not present

## 2018-08-24 DIAGNOSIS — O289 Unspecified abnormal findings on antenatal screening of mother: Secondary | ICD-10-CM | POA: Diagnosis not present

## 2018-08-24 DIAGNOSIS — O0990 Supervision of high risk pregnancy, unspecified, unspecified trimester: Secondary | ICD-10-CM | POA: Insufficient documentation

## 2018-08-24 DIAGNOSIS — O10012 Pre-existing essential hypertension complicating pregnancy, second trimester: Secondary | ICD-10-CM | POA: Diagnosis not present

## 2018-08-24 DIAGNOSIS — Z3A19 19 weeks gestation of pregnancy: Secondary | ICD-10-CM

## 2018-10-06 ENCOUNTER — Ambulatory Visit (HOSPITAL_COMMUNITY): Payer: 59 | Admitting: *Deleted

## 2018-10-06 ENCOUNTER — Other Ambulatory Visit (HOSPITAL_COMMUNITY): Payer: Self-pay | Admitting: *Deleted

## 2018-10-06 ENCOUNTER — Ambulatory Visit (HOSPITAL_COMMUNITY)
Admission: RE | Admit: 2018-10-06 | Discharge: 2018-10-06 | Disposition: A | Discharge status: Home / Self Care | Payer: 59 | Source: Ambulatory Visit | Attending: Obstetrics and Gynecology | Admitting: Obstetrics and Gynecology

## 2018-10-06 ENCOUNTER — Other Ambulatory Visit: Payer: Self-pay

## 2018-10-06 ENCOUNTER — Encounter (HOSPITAL_COMMUNITY): Payer: Self-pay

## 2018-10-06 VITALS — BP 134/92 | HR 80 | Temp 99.2°F

## 2018-10-06 DIAGNOSIS — O09522 Supervision of elderly multigravida, second trimester: Secondary | ICD-10-CM | POA: Diagnosis not present

## 2018-10-06 DIAGNOSIS — O10919 Unspecified pre-existing hypertension complicating pregnancy, unspecified trimester: Secondary | ICD-10-CM

## 2018-10-06 DIAGNOSIS — O289 Unspecified abnormal findings on antenatal screening of mother: Secondary | ICD-10-CM | POA: Diagnosis not present

## 2018-10-06 DIAGNOSIS — O10012 Pre-existing essential hypertension complicating pregnancy, second trimester: Secondary | ICD-10-CM | POA: Diagnosis not present

## 2018-10-06 DIAGNOSIS — O99212 Obesity complicating pregnancy, second trimester: Secondary | ICD-10-CM

## 2018-10-06 DIAGNOSIS — Z362 Encounter for other antenatal screening follow-up: Secondary | ICD-10-CM

## 2018-10-06 DIAGNOSIS — Z3A25 25 weeks gestation of pregnancy: Secondary | ICD-10-CM

## 2018-11-03 ENCOUNTER — Encounter (HOSPITAL_COMMUNITY): Payer: Self-pay

## 2018-11-03 ENCOUNTER — Ambulatory Visit (HOSPITAL_COMMUNITY): Payer: 59

## 2018-11-03 ENCOUNTER — Ambulatory Visit (HOSPITAL_COMMUNITY)
Admission: RE | Admit: 2018-11-03 | Discharge: 2018-11-03 | Disposition: A | Discharge status: Home / Self Care | Payer: 59 | Source: Ambulatory Visit | Attending: Obstetrics and Gynecology | Admitting: Obstetrics and Gynecology

## 2018-11-16 ENCOUNTER — Other Ambulatory Visit (HOSPITAL_COMMUNITY): Payer: Self-pay | Admitting: *Deleted

## 2018-11-16 DIAGNOSIS — O09523 Supervision of elderly multigravida, third trimester: Secondary | ICD-10-CM

## 2018-11-21 ENCOUNTER — Encounter (HOSPITAL_COMMUNITY): Payer: Self-pay

## 2018-11-21 ENCOUNTER — Ambulatory Visit (HOSPITAL_COMMUNITY): Payer: 59

## 2018-12-02 ENCOUNTER — Ambulatory Visit (HOSPITAL_COMMUNITY): Payer: 59 | Admitting: *Deleted

## 2018-12-02 ENCOUNTER — Other Ambulatory Visit (HOSPITAL_COMMUNITY): Payer: Self-pay | Admitting: *Deleted

## 2018-12-02 ENCOUNTER — Other Ambulatory Visit: Payer: Self-pay

## 2018-12-02 ENCOUNTER — Ambulatory Visit (HOSPITAL_COMMUNITY)
Admission: RE | Admit: 2018-12-02 | Discharge: 2018-12-02 | Disposition: A | Discharge status: Home / Self Care | Payer: 59 | Source: Ambulatory Visit | Attending: Obstetrics and Gynecology | Admitting: Obstetrics and Gynecology

## 2018-12-02 ENCOUNTER — Other Ambulatory Visit (HOSPITAL_COMMUNITY): Payer: Self-pay | Admitting: Obstetrics and Gynecology

## 2018-12-02 ENCOUNTER — Encounter (HOSPITAL_COMMUNITY): Payer: Self-pay

## 2018-12-02 VITALS — BP 158/101 | HR 96 | Temp 98.5°F

## 2018-12-02 DIAGNOSIS — O09523 Supervision of elderly multigravida, third trimester: Secondary | ICD-10-CM | POA: Diagnosis present

## 2018-12-02 DIAGNOSIS — O289 Unspecified abnormal findings on antenatal screening of mother: Secondary | ICD-10-CM | POA: Diagnosis not present

## 2018-12-02 DIAGNOSIS — Z362 Encounter for other antenatal screening follow-up: Secondary | ICD-10-CM

## 2018-12-02 DIAGNOSIS — Z862 Personal history of diseases of the blood and blood-forming organs and certain disorders involving the immune mechanism: Secondary | ICD-10-CM

## 2018-12-02 DIAGNOSIS — O10919 Unspecified pre-existing hypertension complicating pregnancy, unspecified trimester: Secondary | ICD-10-CM

## 2018-12-02 DIAGNOSIS — O10013 Pre-existing essential hypertension complicating pregnancy, third trimester: Secondary | ICD-10-CM | POA: Diagnosis not present

## 2018-12-02 DIAGNOSIS — Z3A33 33 weeks gestation of pregnancy: Secondary | ICD-10-CM

## 2018-12-02 DIAGNOSIS — O99213 Obesity complicating pregnancy, third trimester: Secondary | ICD-10-CM

## 2018-12-09 ENCOUNTER — Encounter (HOSPITAL_COMMUNITY): Payer: Self-pay

## 2018-12-09 ENCOUNTER — Inpatient Hospital Stay (HOSPITAL_COMMUNITY)
Admission: AD | Admit: 2018-12-09 | Discharge: 2018-12-09 | Disposition: A | Discharge status: Home / Self Care | Payer: 59 | Attending: Obstetrics and Gynecology | Admitting: Obstetrics and Gynecology

## 2018-12-09 ENCOUNTER — Ambulatory Visit (HOSPITAL_COMMUNITY): Payer: 59 | Admitting: *Deleted

## 2018-12-09 ENCOUNTER — Ambulatory Visit (HOSPITAL_BASED_OUTPATIENT_CLINIC_OR_DEPARTMENT_OTHER)
Admission: RE | Admit: 2018-12-09 | Discharge: 2018-12-09 | Disposition: A | Discharge status: Home / Self Care | Payer: 59 | Source: Ambulatory Visit | Attending: Obstetrics and Gynecology | Admitting: Obstetrics and Gynecology

## 2018-12-09 ENCOUNTER — Other Ambulatory Visit: Payer: Self-pay

## 2018-12-09 VITALS — BP 155/101 | HR 78 | Temp 98.7°F

## 2018-12-09 DIAGNOSIS — O289 Unspecified abnormal findings on antenatal screening of mother: Secondary | ICD-10-CM | POA: Diagnosis not present

## 2018-12-09 DIAGNOSIS — O09523 Supervision of elderly multigravida, third trimester: Secondary | ICD-10-CM | POA: Diagnosis not present

## 2018-12-09 DIAGNOSIS — Z7982 Long term (current) use of aspirin: Secondary | ICD-10-CM | POA: Insufficient documentation

## 2018-12-09 DIAGNOSIS — Z8632 Personal history of gestational diabetes: Secondary | ICD-10-CM | POA: Insufficient documentation

## 2018-12-09 DIAGNOSIS — O99213 Obesity complicating pregnancy, third trimester: Secondary | ICD-10-CM | POA: Diagnosis not present

## 2018-12-09 DIAGNOSIS — O10919 Unspecified pre-existing hypertension complicating pregnancy, unspecified trimester: Secondary | ICD-10-CM | POA: Insufficient documentation

## 2018-12-09 DIAGNOSIS — O10013 Pre-existing essential hypertension complicating pregnancy, third trimester: Secondary | ICD-10-CM | POA: Diagnosis not present

## 2018-12-09 DIAGNOSIS — Z3A34 34 weeks gestation of pregnancy: Secondary | ICD-10-CM | POA: Insufficient documentation

## 2018-12-09 DIAGNOSIS — O10913 Unspecified pre-existing hypertension complicating pregnancy, third trimester: Secondary | ICD-10-CM | POA: Diagnosis not present

## 2018-12-09 DIAGNOSIS — I10 Essential (primary) hypertension: Secondary | ICD-10-CM | POA: Diagnosis present

## 2018-12-09 LAB — CBC
HCT: 30.6 % — ABNORMAL LOW (ref 36.0–46.0)
Hemoglobin: 10.3 g/dL — ABNORMAL LOW (ref 12.0–15.0)
MCH: 24.5 pg — ABNORMAL LOW (ref 26.0–34.0)
MCHC: 33.7 g/dL (ref 30.0–36.0)
MCV: 72.9 fL — ABNORMAL LOW (ref 80.0–100.0)
Platelets: 191 10*3/uL (ref 150–400)
RBC: 4.2 MIL/uL (ref 3.87–5.11)
RDW: 15 % (ref 11.5–15.5)
WBC: 8.6 10*3/uL (ref 4.0–10.5)
nRBC: 0 % (ref 0.0–0.2)

## 2018-12-09 LAB — COMPREHENSIVE METABOLIC PANEL
ALT: 11 U/L (ref 0–44)
AST: 11 U/L — ABNORMAL LOW (ref 15–41)
Albumin: 2.6 g/dL — ABNORMAL LOW (ref 3.5–5.0)
Alkaline Phosphatase: 97 U/L (ref 38–126)
Anion gap: 7 (ref 5–15)
BUN: <5 mg/dL — ABNORMAL LOW (ref 6–20)
CO2: 22 mmol/L (ref 22–32)
Calcium: 8.8 mg/dL — ABNORMAL LOW (ref 8.9–10.3)
Chloride: 107 mmol/L (ref 98–111)
Creatinine, Ser: 0.65 mg/dL (ref 0.44–1.00)
GFR calc Af Amer: >60 mL/min (ref >60)
GFR calc non Af Amer: >60 mL/min (ref >60)
Glucose, Bld: 79 mg/dL (ref 70–99)
Potassium: 3.7 mmol/L (ref 3.5–5.1)
Sodium: 136 mmol/L (ref 135–145)
Total Bilirubin: 0.4 mg/dL (ref 0.3–1.2)
Total Protein: 6.2 g/dL — ABNORMAL LOW (ref 6.5–8.1)

## 2018-12-09 LAB — PROTEIN / CREATININE RATIO, URINE
Creatinine, Urine: 135.36 mg/dL
Protein Creatinine Ratio: 0.12 mg/mg{Cre} (ref 0.00–0.15)
Total Protein, Urine: 16 mg/dL

## 2018-12-09 LAB — URIC ACID: Uric Acid, Serum: 4.5 mg/dL (ref 2.5–7.1)

## 2018-12-09 NOTE — MAU Note (Signed)
PT sent from Dr Garwin Brothers office to be evaluated for possible Pre-E. Has chronic HTN. Denies HA ,vision changes or swelling.  Labs ordered by Dr Garwin Brothers. Denies contractions, VB, LOF. +FM

## 2018-12-09 NOTE — MAU Provider Note (Signed)
History     Chief Complaint  Patient presents with  . Hypertension   33 yo [id number] BF @ 34 5/[redacted] wks gestation sent from office for [facility] labs  To r/o superimposed preeclampsia. Pt was seen earlier in the day by [facility] where markedly elevated BP's were noted. Pt has been compliant with med. Denies h/a, visual changes. (+) proteinuria in office   OB History    Gravida  6   Para  4   Term  4   Preterm      AB  1   Living  4     SAB      TAB  1   Ectopic      Multiple      Live Births  4           Past Medical History:  Diagnosis Date  . Diabetes mellitus   . Gestational diabetes   . Hypertension     Past Surgical History:  Procedure Laterality Date  . mole removed      Family History  Problem Relation Age of Onset  . Hypertension Maternal Grandfather   . Diabetes Maternal Grandfather   . Hypertension Paternal Grandmother   . Diabetes Paternal Grandmother     Social History   Tobacco Use  . Smoking status: Never Smoker  . Smokeless tobacco: Never Used  Substance Use Topics  . Alcohol use: Not Currently    Comment: occasionally  . Drug use: No    Allergies: No Known Allergies  Medications Prior to Admission  Medication Sig Dispense Refill Last Dose  . aspirin EC 81 MG tablet Take 81 mg by mouth daily.   12/09/2018 at Unknown time  . NIFEDIPINE PO Take by mouth.   12/09/2018 at Unknown time  . amLODipine (NORVASC) 5 MG tablet Take 1 tablet (5 mg total) by mouth daily. (Patient not taking: Reported on 10/06/2018) 30 tablet 0   . Ferrous Sulfate (IRON PO) Take by mouth.   Unknown at Unknown time  . metroNIDAZOLE (FLAGYL) 500 MG tablet Take 1 tablet (500 mg total) by mouth 2 (two) times daily. (Patient not taking: Reported on 08/24/2018) 14 tablet 0   . Prenatal Vit-Fe Fumarate-FA (PRENATAL VITAMIN PO) Take by mouth.   Unknown at Unknown time  . vitamin C (ASCORBIC ACID) 250 MG tablet Take 250 mg by mouth 2 (two) times daily.   Unknown at Unknown time      Physical Exam   Blood pressure 129/84, pulse 72, temperature 97.9 F (36.6 C), temperature source Oral, resp. rate 18, height 5\' 3"  (1.6 m), weight 114.9 kg, last menstrual period 04/11/2018, SpO2 99 %. Patient Vitals for the past 24 hrs:  BP Temp Temp src Pulse Resp SpO2 Height Weight  12/09/18 1900 129/84 - - 72 - - - -  12/09/18 1845 - - - - - 99 % - -  12/09/18 1840 140/88 - - 80 - - - -  12/09/18 1826 130/90 97.9 F (36.6 C) Oral 87 18 100 % 5\' 3"  (1.6 m) 114.9 kg   General appearance: alert, cooperative and no distress Abdomen: gravid Extremities: edema +1  Tracing: baseline 130 (+) accel to 155 ED Course  Preexisting chronic HTN affecting pregnancy in the third trimester IUP @ 34 3/7 wk P) PIH labs( r/o superimposed preeclampsia) MDM  Addendum CBC Latest Ref Rng & Units 12/09/2018 05/02/2014 12/12/2008  WBC 4.0 - 10.5 K/uL 8.6 7.3 7.8  Hemoglobin 12.0 - 15.0 g/dL 10.3(L) 12.6  13.1  Hematocrit 36.0 - 46.0 % 30.6(L) 37.5 38.1  Platelets 150 - 400 K/uL 191 194 172   CMP Latest Ref Rng & Units 12/09/2018 05/02/2014 12/12/2008  Glucose 70 - 99 mg/dL 79 409(W106(H) 88  BUN 6 - 20 mg/dL <1(X<5(L) 9 7  Creatinine 9.140.44 - 1.00 mg/dL 7.820.65 9.560.81 0.8  Sodium 213135 - 145 mmol/L 136 137 141  Potassium 3.5 - 5.1 mmol/L 3.7 3.7 3.8  Chloride 98 - 111 mmol/L 107 109 106  CO2 22 - 32 mmol/L 22 24 28   Calcium 8.9 - 10.3 mg/dL 0.8(M8.8(L) 8.7 9.0  Total Protein 6.5 - 8.1 g/dL 6.2(L) 7.8 -  Total Bilirubin 0.3 - 1.2 mg/dL 0.4 0.6 -  Alkaline Phos 38 - 126 U/L 97 80 -  AST 15 - 41 U/L 11(L) 16 -  ALT 0 - 44 U/L 11 15 -  PCR 0.12 D/c home. No evidence of superimposed preeclampsia Cont with current dose of antihypertensive med. Cont close fetal surveillance.  Keep sched OB and MFM appts Serita KyleSheronette A Javiana Anwar, MD 7:18 PM 12/09/2018

## 2018-12-09 NOTE — Discharge Instructions (Signed)
Hypertension During Pregnancy °Hypertension is also called high blood pressure. High blood pressure means that the force of your blood moving in your body is too strong. It can cause problems for you and your baby. Different types of high blood pressure can happen during pregnancy. The types are: °· High blood pressure before you got pregnant. This is called chronic hypertension.  This can continue during your pregnancy. Your doctor will want to keep checking your blood pressure. You may need medicine to keep your blood pressure under control while you are pregnant. You will need follow-up visits after you have your baby. °· High blood pressure that goes up during pregnancy when it was normal before. This is called gestational hypertension. It will usually get better after you have your baby, but your doctor will need to watch your blood pressure to make sure that it is getting better. °· Very high blood pressure during pregnancy. This is called preeclampsia. Very high blood pressure is an emergency that needs to be checked and treated right away. °· You may develop very high blood pressure after giving birth. This is called postpartum preeclampsia. This usually occurs within 48 hours after childbirth but may occur up to 6 weeks after giving birth. This is rare. °How does this affect me? °If you have high blood pressure during pregnancy, you have a higher chance of developing high blood pressure: °· As you get older. °· If you get pregnant again. °In some cases, high blood pressure during pregnancy can cause: °· Stroke. °· Heart attack. °· Damage to the kidneys, lungs, or liver. °· Preeclampsia. °· Jerky movements you cannot control (convulsions or seizures). °· Problems with the placenta. °How does this affect my baby? °Your baby may: °· Be born early. °· Not weigh as much as he or she should. °· Not handle labor well, leading to a c-section birth. °What are the risks? °· Having high blood pressure during a past  pregnancy. °· Being overweight. °· Being 35 years old or older. °· Being pregnant for the first time. °· Being pregnant with more than one baby. °· Becoming pregnant using fertility methods, such as IVF. °· Having other problems, such as diabetes, or kidney disease. °· Having family members who have high blood pressure. °What can I do to lower my risk? ° °· Keep a healthy weight. °· Eat a healthy diet. °· Follow what your doctor tells you about treating any medical problems that you had before becoming pregnant. °It is very important to go to all of your doctor visits. Your doctor will check your blood pressure and make sure that your pregnancy is progressing as it should. Treatment should start early if a problem is found. °How is this treated? °Treatment for high blood pressure during pregnancy can differ depending on the type of high blood pressure you have and how serious it is. °· You may need to take blood pressure medicine. °· If you have been taking medicine for your blood pressure, you may need to change the medicine during pregnancy if it is not safe for your baby. °· If your doctor thinks that you could get very high blood pressure, he or she may tell you to take a low-dose aspirin during your pregnancy. °· If you have very high blood pressure, you may need to stay in the hospital so you and your baby can be watched closely. You may also need to take medicine to lower your blood pressure. This medicine may be given by mouth   or through an IV tube. °· In some cases, if your condition gets worse, you may need to have your baby early. °Follow these instructions at home: °Eating and drinking ° °· Drink enough fluid to keep your pee (urine) pale yellow. °· Avoid caffeine. °Lifestyle °· Do not use any products that contain nicotine or tobacco, such as cigarettes, e-cigarettes, and chewing tobacco. If you need help quitting, ask your doctor. °· Do not use alcohol or drugs. °· Avoid stress. °· Rest and get plenty  of sleep. °· Regular exercise can help. Ask your doctor what kinds of exercise are best for you. °General instructions °· Take over-the-counter and prescription medicines only as told by your doctor. °· Keep all prenatal and follow-up visits as told by your doctor. This is important. °Contact a doctor if: °· You have symptoms that your doctor told you to watch for, such as: °? Headaches. °? Nausea. °? Vomiting. °? Belly (abdominal) pain. °? Dizziness. °? Light-headedness. °Get help right away if: °· You have: °? Very bad belly pain that does not get better with treatment. °? A very bad headache that does not get better. °? Vomiting that does not get better. °? Sudden, fast weight gain. °? Sudden swelling in your hands, ankles, or face. °? Bleeding from your vagina. °? Blood in your pee. °? Blurry vision. °? Double vision. °? Shortness of breath. °? Chest pain. °? Weakness on one side of your body. °? Trouble talking. °· Your baby is not moving as much as usual. °Summary °· High blood pressure is also called hypertension. °· High blood pressure means that the force of your blood moving in your body is too strong. °· High blood pressure can cause problems for you and your baby. °· Keep all follow-up visits as told by your doctor. This is important. °This information is not intended to replace advice given to you by your health care provider. Make sure you discuss any questions you have with your health care provider. °Document Released: 04/11/2010 Document Revised: 06/30/2018 Document Reviewed: 04/05/2018 °Elsevier Patient Education © 2020 [employer] ° °

## 2018-12-15 ENCOUNTER — Ambulatory Visit (HOSPITAL_COMMUNITY): Payer: 59

## 2018-12-16 LAB — OB RESULTS CONSOLE GBS: GBS: NEGATIVE

## 2018-12-19 ENCOUNTER — Telehealth (HOSPITAL_COMMUNITY): Payer: Self-pay | Admitting: *Deleted

## 2018-12-19 ENCOUNTER — Encounter (HOSPITAL_COMMUNITY): Payer: Self-pay | Admitting: *Deleted

## 2018-12-19 NOTE — Telephone Encounter (Signed)
Preadmission screen  

## 2018-12-20 ENCOUNTER — Encounter (HOSPITAL_COMMUNITY): Payer: Self-pay | Admitting: *Deleted

## 2018-12-20 ENCOUNTER — Telehealth (HOSPITAL_COMMUNITY): Payer: Self-pay | Admitting: *Deleted

## 2018-12-20 NOTE — Telephone Encounter (Signed)
Preadmission screen  

## 2018-12-22 ENCOUNTER — Ambulatory Visit (HOSPITAL_COMMUNITY): Payer: 59

## 2018-12-23 ENCOUNTER — Other Ambulatory Visit: Payer: Self-pay | Admitting: Obstetrics and Gynecology

## 2018-12-24 ENCOUNTER — Other Ambulatory Visit: Payer: Self-pay

## 2018-12-24 ENCOUNTER — Ambulatory Visit (HOSPITAL_COMMUNITY)
Admission: RE | Admit: 2018-12-24 | Discharge: 2018-12-24 | Disposition: A | Discharge status: Home / Self Care | Payer: 59 | Source: Ambulatory Visit | Attending: Obstetrics and Gynecology | Admitting: Obstetrics and Gynecology

## 2018-12-24 LAB — SARS CORONAVIRUS 2 (TAT 6-24 HRS): SARS Coronavirus 2: NEGATIVE

## 2018-12-24 NOTE — MAU Note (Signed)
Pt here for PAT covid swab. Denies symptoms. Swab collected.  

## 2018-12-26 ENCOUNTER — Inpatient Hospital Stay (HOSPITAL_COMMUNITY)
Admission: AD | Admit: 2018-12-26 | Discharge: 2018-12-29 | DRG: 807 | Disposition: A | Discharge status: Home / Self Care | Payer: 59 | Attending: Obstetrics and Gynecology | Admitting: Obstetrics and Gynecology

## 2018-12-26 ENCOUNTER — Inpatient Hospital Stay (HOSPITAL_COMMUNITY): Payer: 59

## 2018-12-26 ENCOUNTER — Other Ambulatory Visit: Payer: Self-pay

## 2018-12-26 ENCOUNTER — Encounter (HOSPITAL_COMMUNITY): Payer: Self-pay | Admitting: *Deleted

## 2018-12-26 ENCOUNTER — Inpatient Hospital Stay (HOSPITAL_COMMUNITY): Payer: 59 | Admitting: Anesthesiology

## 2018-12-26 DIAGNOSIS — Z3A37 37 weeks gestation of pregnancy: Secondary | ICD-10-CM

## 2018-12-26 DIAGNOSIS — D573 Sickle-cell trait: Secondary | ICD-10-CM | POA: Diagnosis present

## 2018-12-26 DIAGNOSIS — O26893 Other specified pregnancy related conditions, third trimester: Secondary | ICD-10-CM | POA: Diagnosis present

## 2018-12-26 DIAGNOSIS — E669 Obesity, unspecified: Secondary | ICD-10-CM | POA: Diagnosis present

## 2018-12-26 DIAGNOSIS — O10013 Pre-existing essential hypertension complicating pregnancy, third trimester: Secondary | ICD-10-CM

## 2018-12-26 DIAGNOSIS — L0232 Furuncle of buttock: Secondary | ICD-10-CM | POA: Diagnosis present

## 2018-12-26 DIAGNOSIS — O1002 Pre-existing essential hypertension complicating childbirth: Secondary | ICD-10-CM | POA: Diagnosis present

## 2018-12-26 DIAGNOSIS — O9902 Anemia complicating childbirth: Secondary | ICD-10-CM | POA: Diagnosis present

## 2018-12-26 DIAGNOSIS — L989 Disorder of the skin and subcutaneous tissue, unspecified: Secondary | ICD-10-CM

## 2018-12-26 DIAGNOSIS — Z3490 Encounter for supervision of normal pregnancy, unspecified, unspecified trimester: Secondary | ICD-10-CM | POA: Diagnosis present

## 2018-12-26 DIAGNOSIS — O99214 Obesity complicating childbirth: Secondary | ICD-10-CM | POA: Diagnosis present

## 2018-12-26 DIAGNOSIS — Z20828 Contact with and (suspected) exposure to other viral communicable diseases: Secondary | ICD-10-CM | POA: Diagnosis present

## 2018-12-26 LAB — COMPREHENSIVE METABOLIC PANEL
ALT: 12 U/L (ref 0–44)
AST: 14 U/L — ABNORMAL LOW (ref 15–41)
Albumin: 2.4 g/dL — ABNORMAL LOW (ref 3.5–5.0)
Alkaline Phosphatase: 95 U/L (ref 38–126)
Anion gap: 7 (ref 5–15)
BUN: <5 mg/dL — ABNORMAL LOW (ref 6–20)
CO2: 20 mmol/L — ABNORMAL LOW (ref 22–32)
Calcium: 8.5 mg/dL — ABNORMAL LOW (ref 8.9–10.3)
Chloride: 110 mmol/L (ref 98–111)
Creatinine, Ser: 0.67 mg/dL (ref 0.44–1.00)
GFR calc Af Amer: >60 mL/min (ref >60)
GFR calc non Af Amer: >60 mL/min (ref >60)
Glucose, Bld: 123 mg/dL — ABNORMAL HIGH (ref 70–99)
Potassium: 3.5 mmol/L (ref 3.5–5.1)
Sodium: 137 mmol/L (ref 135–145)
Total Bilirubin: 0.8 mg/dL (ref 0.3–1.2)
Total Protein: 5.8 g/dL — ABNORMAL LOW (ref 6.5–8.1)

## 2018-12-26 LAB — CBC
HCT: 29.9 % — ABNORMAL LOW (ref 36.0–46.0)
HCT: 28.8 % — ABNORMAL LOW (ref 36.0–46.0)
Hemoglobin: 9.7 g/dL — ABNORMAL LOW (ref 12.0–15.0)
Hemoglobin: 9.6 g/dL — ABNORMAL LOW (ref 12.0–15.0)
MCH: 23.5 pg — ABNORMAL LOW (ref 26.0–34.0)
MCH: 23.8 pg — ABNORMAL LOW (ref 26.0–34.0)
MCHC: 32.4 g/dL (ref 30.0–36.0)
MCHC: 33.3 g/dL (ref 30.0–36.0)
MCV: 72.6 fL — ABNORMAL LOW (ref 80.0–100.0)
MCV: 71.3 fL — ABNORMAL LOW (ref 80.0–100.0)
Platelets: 193 10*3/uL (ref 150–400)
Platelets: 185 10*3/uL (ref 150–400)
RBC: 4.12 MIL/uL (ref 3.87–5.11)
RBC: 4.04 MIL/uL (ref 3.87–5.11)
RDW: 15.8 % — ABNORMAL HIGH (ref 11.5–15.5)
RDW: 15.5 % (ref 11.5–15.5)
WBC: 9.3 10*3/uL (ref 4.0–10.5)
WBC: 9.6 10*3/uL (ref 4.0–10.5)
nRBC: 0 % (ref 0.0–0.2)
nRBC: 0 % (ref 0.0–0.2)

## 2018-12-26 LAB — RPR: RPR Ser Ql: NONREACTIVE

## 2018-12-26 LAB — TYPE AND SCREEN
ABO/RH(D): O POS
Antibody Screen: NEGATIVE

## 2018-12-26 LAB — ABO/RH: ABO/RH(D): O POS

## 2018-12-26 LAB — PROTEIN / CREATININE RATIO, URINE
Creatinine, Urine: 55.45 mg/dL
Protein Creatinine Ratio: 0.2 mg/mg{Cre} — ABNORMAL HIGH (ref 0.00–0.15)
Total Protein, Urine: 11 mg/dL

## 2018-12-26 MED ORDER — PHENYLEPHRINE 40 MCG/ML (10ML) SYRINGE FOR IV PUSH (FOR BLOOD PRESSURE SUPPORT)
80.0000 ug | PREFILLED_SYRINGE | INTRAVENOUS | Status: DC | PRN
  Filled 2018-12-26: qty 10

## 2018-12-26 MED ORDER — LACTATED RINGERS IV SOLN: 500.0000 mL | Freq: Once | INTRAVENOUS | Status: DC

## 2018-12-26 MED ORDER — NIFEDIPINE ER OSMOTIC RELEASE 30 MG PO TB24
30.0000 mg | ORAL_TABLET | Freq: Every day | ORAL | Status: DC
  Administered 2018-12-26 – 2018-12-27 (×2): 30 mg via ORAL
  Filled 2018-12-26 (×2): qty 1

## 2018-12-26 MED ORDER — OXYTOCIN 40 UNITS IN NORMAL SALINE INFUSION - SIMPLE MED
2.5000 [IU]/h | INTRAVENOUS | Status: DC
  Administered 2018-12-27: 2.5 [IU]/h via INTRAVENOUS

## 2018-12-26 MED ORDER — SODIUM CHLORIDE (PF) 0.9 % IJ SOLN
INTRAMUSCULAR | Status: DC | PRN
  Administered 2018-12-26: 12 mL/h via EPIDURAL

## 2018-12-26 MED ORDER — LACTATED RINGERS IV SOLN: 500.0000 mL | INTRAVENOUS | Status: DC | PRN

## 2018-12-26 MED ORDER — ACETAMINOPHEN 325 MG PO TABS: 650.0000 mg | ORAL_TABLET | ORAL | Status: DC | PRN

## 2018-12-26 MED ORDER — EPHEDRINE 5 MG/ML INJ: 10.0000 mg | INTRAVENOUS | Status: DC | PRN

## 2018-12-26 MED ORDER — ONDANSETRON HCL 4 MG/2ML IJ SOLN: 4.0000 mg | Freq: Four times a day (QID) | INTRAMUSCULAR | Status: DC | PRN

## 2018-12-26 MED ORDER — HYDRALAZINE HCL 20 MG/ML IJ SOLN: 10.0000 mg | INTRAMUSCULAR | Status: DC | PRN

## 2018-12-26 MED ORDER — TERBUTALINE SULFATE 1 MG/ML IJ SOLN: 0.2500 mg | Freq: Once | INTRAMUSCULAR | Status: DC | PRN

## 2018-12-26 MED ORDER — LABETALOL HCL 5 MG/ML IV SOLN: 80.0000 mg | INTRAVENOUS | Status: DC | PRN

## 2018-12-26 MED ORDER — LABETALOL HCL 5 MG/ML IV SOLN
20.0000 mg | INTRAVENOUS | Status: DC | PRN
  Administered 2018-12-26 – 2018-12-27 (×2): 20 mg via INTRAVENOUS
  Filled 2018-12-26 (×2): qty 4

## 2018-12-26 MED ORDER — DIPHENHYDRAMINE HCL 50 MG/ML IJ SOLN: 12.5000 mg | INTRAMUSCULAR | Status: DC | PRN

## 2018-12-26 MED ORDER — FENTANYL-BUPIVACAINE-NACL 0.5-0.125-0.9 MG/250ML-% EP SOLN
12.0000 mL/h | EPIDURAL | Status: DC | PRN
  Filled 2018-12-26: qty 250

## 2018-12-26 MED ORDER — SOD CITRATE-CITRIC ACID 500-334 MG/5ML PO SOLN: 30.0000 mL | ORAL | Status: DC | PRN

## 2018-12-26 MED ORDER — LACTATED RINGERS IV SOLN
INTRAVENOUS | Status: DC
  Administered 2018-12-26 (×3): via INTRAVENOUS

## 2018-12-26 MED ORDER — OXYTOCIN 40 UNITS IN NORMAL SALINE INFUSION - SIMPLE MED
1.0000 m[IU]/min | INTRAVENOUS | Status: DC
  Administered 2018-12-26: 17:00:00 2 m[IU]/min via INTRAVENOUS
  Filled 2018-12-26: qty 1000

## 2018-12-26 MED ORDER — LIDOCAINE-EPINEPHRINE (PF) 2 %-1:200000 IJ SOLN
INTRAMUSCULAR | Status: DC | PRN
  Administered 2018-12-26 (×2): 2 mL via EPIDURAL

## 2018-12-26 MED ORDER — LABETALOL HCL 5 MG/ML IV SOLN: 40.0000 mg | INTRAVENOUS | Status: DC | PRN

## 2018-12-26 MED ORDER — OXYTOCIN BOLUS FROM INFUSION
500.0000 mL | Freq: Once | INTRAVENOUS | Status: AC
  Administered 2018-12-27: 500 mL via INTRAVENOUS

## 2018-12-26 MED ORDER — FENTANYL CITRATE (PF) 100 MCG/2ML IJ SOLN
INTRAMUSCULAR | Status: AC
  Administered 2018-12-26: 14:00:00 100 ug via INTRAVENOUS
  Filled 2018-12-26: qty 2

## 2018-12-26 MED ORDER — PHENYLEPHRINE 40 MCG/ML (10ML) SYRINGE FOR IV PUSH (FOR BLOOD PRESSURE SUPPORT): 80.0000 ug | PREFILLED_SYRINGE | INTRAVENOUS | Status: DC | PRN

## 2018-12-26 MED ORDER — FENTANYL CITRATE (PF) 100 MCG/2ML IJ SOLN
100.0000 ug | INTRAMUSCULAR | Status: DC | PRN
  Administered 2018-12-26 (×2): 100 ug via INTRAVENOUS
  Filled 2018-12-26: qty 2

## 2018-12-26 MED ORDER — LIDOCAINE HCL (PF) 1 % IJ SOLN
30.0000 mL | INTRAMUSCULAR | Status: AC | PRN
  Administered 2018-12-27: 30 mL via SUBCUTANEOUS
  Filled 2018-12-26: qty 30

## 2018-12-26 MED ORDER — MISOPROSTOL 50MCG HALF TABLET
50.0000 ug | ORAL_TABLET | ORAL | Status: DC
  Administered 2018-12-26 – 2018-12-27 (×5): 50 ug via ORAL
  Filled 2018-12-26 (×5): qty 1

## 2018-12-26 NOTE — Anesthesia Procedure Notes (Signed)
Epidural Patient location during procedure: OB Start time: 12/26/2018 5:25 PM End time: 12/26/2018 5:40 PM  Staffing Anesthesiologist: Freddrick March, MD Performed: anesthesiologist   Preanesthetic Checklist Completed: patient identified, pre-op evaluation, timeout performed, IV checked, risks and benefits discussed and monitors and equipment checked  Epidural Patient position: sitting Prep: site prepped and draped and DuraPrep Patient monitoring: continuous pulse ox, blood pressure, heart rate and cardiac monitor Approach: midline Location: L3-L4 Injection technique: LOR air  Needle:  Needle type: Tuohy  Needle gauge: 17 G Needle length: 9 cm Needle insertion depth: 7 cm Catheter type: closed end flexible Catheter size: 19 Gauge Catheter at skin depth: 13 cm Test dose: negative  Assessment Sensory level: T8 Events: blood not aspirated, injection not painful, no injection resistance, negative IV test and no paresthesia  Additional Notes Patient identified. Risks/Benefits/Options discussed with patient including but not limited to bleeding, infection, nerve damage, paralysis, failed block, incomplete pain control, headache, blood pressure changes, nausea, vomiting, reactions to medication both or allergic, itching and postpartum back pain. Confirmed with bedside nurse the patient's most recent platelet count. Confirmed with patient that they are not currently taking any anticoagulation, have any bleeding history or any family history of bleeding disorders. Patient expressed understanding and wished to proceed. All questions were answered. Sterile technique was used throughout the entire procedure. Please see nursing notes for vital signs. Test dose was given through epidural catheter and negative prior to continuing to dose epidural or start infusion. Warning signs of high block given to the patient including shortness of breath, tingling/numbness in hands, complete motor block,  or any concerning symptoms with instructions to call for help. Patient was given instructions on fall risk and not to get out of bed. All questions and concerns addressed with instructions to call with any issues or inadequate analgesia.  Reason for block:procedure for pain

## 2018-12-26 NOTE — H&P (Signed)
Madeline Cooke is a 33 y.o. female presenting @ 22 wk for IOL per MFM due to chronic HTN OB History    Gravida  6   Para  4   Term  4   Preterm      AB  1   Living  4     SAB      TAB  1   Ectopic      Multiple      Live Births  4          Past Medical History:  Diagnosis Date  . Anemia   . Diabetes mellitus   . Gestational diabetes   . Hypertension    Past Surgical History:  Procedure Laterality Date  . mole removed     Family History: family history includes Diabetes in her maternal grandfather and paternal grandmother; Hypertension in her maternal grandfather and paternal grandmother. Social History:  reports that she has never smoked. She has never used smokeless tobacco. She reports previous alcohol use. She reports that she does not use drugs.     Maternal Diabetes: No Genetic Screening: Abnormal:  Results: Other:low FF NIPT Maternal Ultrasounds/Referrals: Normal Fetal Ultrasounds or other Referrals:  Referred to Materal Fetal Medicine  Maternal Substance Abuse:  No Significant Maternal Medications:  Meds include: Other: nifedipine. Baby ASA Significant Maternal Lab Results:  Group B Strep negative Other Comments:  SS trait. chronic HTN declines amnio  Review of Systems  Eyes: Negative for blurred vision.  Gastrointestinal: Negative for heartburn.  Neurological: Negative for headaches.   History Dilation: 1 Effacement (%): Thick Station: Ballotable Exam by:: Dr. Garwin Brothers Blood pressure (!) 159/98, pulse 86, temperature 99.2 F (37.3 C), temperature source Oral, resp. rate 18, height 5\' 3"  (1.6 m), weight 114.9 kg, last menstrual period 04/11/2018. Exam Physical Exam  Constitutional: She is oriented to person, place, and time. She appears well-developed and well-nourished.  HENT:  Head: Atraumatic.  Eyes: EOM are normal.  Neck: Neck supple.  Cardiovascular: Regular rhythm.  GI: Soft.  Genitourinary:    Genitourinary Comments: Right  medial buttock. Quarter size fluctuant caruncle   Musculoskeletal:        General: Edema present.  Neurological: She is alert and oriented to person, place, and time. She has normal reflexes.  Skin: Skin is warm and dry.  Psychiatric: She has a normal mood and affect.    Prenatal labs: ABO, Rh: --/--/O POS, O POS Performed at [facility], [place] [address] [place], [place] [address]  [phone]) Antibody: NEG (10/05 0732) Rubella: Immune (04/08 0000) RPR: Nonreactive (04/08 0000)  HBsAg: Negative (04/08 0000)  HIV: Non-reactive (04/08 0000)  GBS:   negative  Assessment/Plan: Chronic HTN complicating pregnancy Obesity in pregnancy IUP@ 37 week Right buttock caruncle multiparity P) admit [place] labs. Cytotec( oral). intracervical ballon placed. Analgesic prn   Mandalyn Pasqua A Lamica Mccart 12/26/2018, 10:05 AM

## 2018-12-26 NOTE — Progress Notes (Signed)
S: epidural  O: pitocin BP 129/73   Pulse 87   Temp 98.9 F (37.2 C) (Axillary)   Resp 14   Ht 5\' 3"  (1.6 m)   Wt 114.9 kg   LMP 04/11/2018 (Exact Date)   BMI 44.87 kg/m  VE 3/50/-3 intact sl bloody show  Tracing: baseline  135 (+) accels Ctx q 1-4 mins  IMP: chronic HTN on  Med Latent phase P) d/c pitocin. Resume cytote( oral)

## 2018-12-26 NOTE — Anesthesia Preprocedure Evaluation (Signed)
Anesthesia Evaluation  Patient identified by MRN, date of birth, ID band Patient awake    Reviewed: Allergy & Precautions, NPO status , Patient's Chart, lab work & pertinent test results  Airway Mallampati: III  TM Distance: >3 FB Neck ROM: Full    Dental no notable dental hx.    Pulmonary neg pulmonary ROS,    Pulmonary exam normal breath sounds clear to auscultation       Cardiovascular hypertension, negative cardio ROS Normal cardiovascular exam Rhythm:Regular Rate:Normal     Neuro/Psych negative neurological ROS  negative psych ROS   GI/Hepatic negative GI ROS, Neg liver ROS,   Endo/Other  diabetes, GestationalMorbid obesity  Renal/GU negative Renal ROS  negative genitourinary   Musculoskeletal negative musculoskeletal ROS (+)   Abdominal   Peds  Hematology  (+) Blood dyscrasia (Hgb 9.7), anemia ,   Anesthesia Other Findings   Reproductive/Obstetrics (+) Pregnancy                             Anesthesia Physical Anesthesia Plan  ASA: III  Anesthesia Plan: Epidural   Post-op Pain Management:    Induction:   PONV Risk Score and Plan: Treatment may vary due to age or medical condition  Airway Management Planned: Natural Airway  Additional Equipment:   Intra-op Plan:   Post-operative Plan:   Informed Consent: I have reviewed the patients History and Physical, chart, labs and discussed the procedure including the risks, benefits and alternatives for the proposed anesthesia with the patient or authorized representative who has indicated his/her understanding and acceptance.       Plan Discussed with: Anesthesiologist  Anesthesia Plan Comments: (Patient identified. Risks, benefits, options discussed with patient including but not limited to bleeding, infection, nerve damage, paralysis, failed block, incomplete pain control, headache, blood pressure changes, nausea,  vomiting, reactions to medication, itching, and post partum back pain. Confirmed with bedside nurse the patient's most recent platelet count. Confirmed with the patient that they are not taking any anticoagulation, have any bleeding history or any family history of bleeding disorders. Patient expressed understanding and wishes to proceed. All questions were answered. )        Anesthesia Quick Evaluation

## 2018-12-27 ENCOUNTER — Encounter (HOSPITAL_COMMUNITY): Payer: Self-pay | Admitting: Obstetrics and Gynecology

## 2018-12-27 DIAGNOSIS — L989 Disorder of the skin and subcutaneous tissue, unspecified: Secondary | ICD-10-CM

## 2018-12-27 DIAGNOSIS — O10013 Pre-existing essential hypertension complicating pregnancy, third trimester: Secondary | ICD-10-CM | POA: Diagnosis present

## 2018-12-27 HISTORY — DX: Pre-existing essential hypertension complicating pregnancy, third trimester: O10.013

## 2018-12-27 HISTORY — DX: Disorder of the skin and subcutaneous tissue, unspecified: L98.9

## 2018-12-27 LAB — CBC
HCT: 31.1 % — ABNORMAL LOW (ref 36.0–46.0)
Hemoglobin: 10.2 g/dL — ABNORMAL LOW (ref 12.0–15.0)
MCH: 23.6 pg — ABNORMAL LOW (ref 26.0–34.0)
MCHC: 32.8 g/dL (ref 30.0–36.0)
MCV: 72 fL — ABNORMAL LOW (ref 80.0–100.0)
Platelets: 198 10*3/uL (ref 150–400)
RBC: 4.32 MIL/uL (ref 3.87–5.11)
RDW: 15.8 % — ABNORMAL HIGH (ref 11.5–15.5)
WBC: 14.9 10*3/uL — ABNORMAL HIGH (ref 4.0–10.5)
nRBC: 0 % (ref 0.0–0.2)

## 2018-12-27 LAB — COMPREHENSIVE METABOLIC PANEL
ALT: 11 U/L (ref 0–44)
AST: 13 U/L — ABNORMAL LOW (ref 15–41)
Albumin: 2.1 g/dL — ABNORMAL LOW (ref 3.5–5.0)
Alkaline Phosphatase: 109 U/L (ref 38–126)
Anion gap: 11 (ref 5–15)
BUN: <5 mg/dL — ABNORMAL LOW (ref 6–20)
CO2: 19 mmol/L — ABNORMAL LOW (ref 22–32)
Calcium: 8.5 mg/dL — ABNORMAL LOW (ref 8.9–10.3)
Chloride: 107 mmol/L (ref 98–111)
Creatinine, Ser: 0.69 mg/dL (ref 0.44–1.00)
GFR calc Af Amer: >60 mL/min (ref >60)
GFR calc non Af Amer: >60 mL/min (ref >60)
Glucose, Bld: 181 mg/dL — ABNORMAL HIGH (ref 70–99)
Potassium: 3.2 mmol/L — ABNORMAL LOW (ref 3.5–5.1)
Sodium: 137 mmol/L (ref 135–145)
Total Bilirubin: 0.7 mg/dL (ref 0.3–1.2)
Total Protein: 5.4 g/dL — ABNORMAL LOW (ref 6.5–8.1)

## 2018-12-27 MED ORDER — COCONUT OIL OIL
1.0000 "application " | TOPICAL_OIL | Status: DC | PRN
  Administered 2018-12-27: 1 via TOPICAL

## 2018-12-27 MED ORDER — OXYCODONE HCL 5 MG PO TABS
10.0000 mg | ORAL_TABLET | ORAL | Status: DC | PRN
  Administered 2018-12-28 – 2018-12-29 (×2): 10 mg via ORAL
  Filled 2018-12-27: qty 2

## 2018-12-27 MED ORDER — ONDANSETRON HCL 4 MG/2ML IJ SOLN: 4.0000 mg | INTRAMUSCULAR | Status: DC | PRN

## 2018-12-27 MED ORDER — ZOLPIDEM TARTRATE 5 MG PO TABS: 5.0000 mg | ORAL_TABLET | Freq: Every evening | ORAL | Status: DC | PRN

## 2018-12-27 MED ORDER — NIFEDIPINE ER OSMOTIC RELEASE 30 MG PO TB24
60.0000 mg | ORAL_TABLET | Freq: Every day | ORAL | Status: DC
  Administered 2018-12-27 – 2018-12-28 (×2): 60 mg via ORAL
  Filled 2018-12-27: qty 1
  Filled 2018-12-27 (×2): qty 2

## 2018-12-27 MED ORDER — SIMETHICONE 80 MG PO CHEW: 80.0000 mg | CHEWABLE_TABLET | ORAL | Status: DC | PRN

## 2018-12-27 MED ORDER — DIBUCAINE (PERIANAL) 1 % EX OINT: 1.0000 "application " | TOPICAL_OINTMENT | CUTANEOUS | Status: DC | PRN

## 2018-12-27 MED ORDER — MEDROXYPROGESTERONE ACETATE 150 MG/ML IM SUSP: 150.0000 mg | INTRAMUSCULAR | Status: DC | PRN

## 2018-12-27 MED ORDER — DIPHENHYDRAMINE HCL 25 MG PO CAPS: 25.0000 mg | ORAL_CAPSULE | Freq: Four times a day (QID) | ORAL | Status: DC | PRN

## 2018-12-27 MED ORDER — ONDANSETRON HCL 4 MG PO TABS: 4.0000 mg | ORAL_TABLET | ORAL | Status: DC | PRN

## 2018-12-27 MED ORDER — BENZOCAINE-MENTHOL 20-0.5 % EX AERO: 1.0000 "application " | INHALATION_SPRAY | CUTANEOUS | Status: DC | PRN

## 2018-12-27 MED ORDER — IBUPROFEN 600 MG PO TABS
600.0000 mg | ORAL_TABLET | Freq: Four times a day (QID) | ORAL | Status: DC
  Administered 2018-12-27 – 2018-12-29 (×9): 600 mg via ORAL
  Filled 2018-12-27 (×10): qty 1

## 2018-12-27 MED ORDER — FERROUS SULFATE 325 (65 FE) MG PO TABS
325.0000 mg | ORAL_TABLET | Freq: Two times a day (BID) | ORAL | Status: DC
  Administered 2018-12-27: 18:00:00 325 mg via ORAL
  Filled 2018-12-27 (×2): qty 1

## 2018-12-27 MED ORDER — MEASLES, MUMPS & RUBELLA VAC IJ SOLR: 0.5000 mL | Freq: Once | INTRAMUSCULAR | Status: DC

## 2018-12-27 MED ORDER — SULFAMETHOXAZOLE-TRIMETHOPRIM 800-160 MG PO TABS
1.0000 | ORAL_TABLET | Freq: Two times a day (BID) | ORAL | Status: DC
  Administered 2018-12-27 – 2018-12-29 (×5): 1 via ORAL
  Filled 2018-12-27 (×7): qty 1

## 2018-12-27 MED ORDER — SENNOSIDES-DOCUSATE SODIUM 8.6-50 MG PO TABS
2.0000 | ORAL_TABLET | ORAL | Status: DC
  Administered 2018-12-28 (×2): 2 via ORAL
  Filled 2018-12-27 (×2): qty 2

## 2018-12-27 MED ORDER — ACETAMINOPHEN 325 MG PO TABS
650.0000 mg | ORAL_TABLET | ORAL | Status: DC | PRN
  Administered 2018-12-28: 650 mg via ORAL
  Filled 2018-12-27: qty 2

## 2018-12-27 MED ORDER — PRENATAL MULTIVITAMIN CH
1.0000 | ORAL_TABLET | Freq: Every day | ORAL | Status: DC
  Administered 2018-12-28 – 2018-12-29 (×2): 1 via ORAL
  Filled 2018-12-27 (×2): qty 1

## 2018-12-27 MED ORDER — HYDROCHLOROTHIAZIDE 25 MG PO TABS
25.0000 mg | ORAL_TABLET | Freq: Every day | ORAL | Status: DC
  Administered 2018-12-27 – 2018-12-29 (×3): 25 mg via ORAL
  Filled 2018-12-27 (×3): qty 1

## 2018-12-27 MED ORDER — OXYCODONE HCL 5 MG PO TABS
5.0000 mg | ORAL_TABLET | ORAL | Status: DC | PRN
  Filled 2018-12-27 (×2): qty 1

## 2018-12-27 MED ORDER — WITCH HAZEL-GLYCERIN EX PADS: 1.0000 "application " | MEDICATED_PAD | CUTANEOUS | Status: DC | PRN

## 2018-12-27 MED ORDER — TETANUS-DIPHTH-ACELL PERTUSSIS 5-2.5-18.5 LF-MCG/0.5 IM SUSP: 0.5000 mL | Freq: Once | INTRAMUSCULAR | Status: DC

## 2018-12-27 NOTE — Progress Notes (Signed)
S: no complaint  O: s/p cytotec @ 2:36 am Patient Vitals for the past 24 hrs:  BP Temp Temp src Pulse Resp Height Weight  12/27/18 0300 126/76 - - 85 - - -  12/27/18 0238 - 99.4 F (37.4 C) Oral - - - -  12/27/18 0230 115/73 - - 91 - - -  12/27/18 0200 130/80 - - 92 16 - -  12/27/18 0130 130/78 - - 88 16 - -  12/27/18 0037 131/81 - - 86 - - -  12/27/18 0030 (!) 125/106 - - 96 - - -  12/27/18 0000 131/69 - - 90 - - -  12/26/18 2330 133/83 - - 97 - - -  12/26/18 2300 135/86 - - 91 - - -  12/26/18 2230 135/84 - - 80 - - -  12/26/18 2200 127/84 - - 85 - - -  12/26/18 2130 130/83 - - 89 - - -  12/26/18 2100 (!) 141/78 - - 92 16 - -  12/26/18 2030 127/81 - - 93 16 - -  12/26/18 2000 132/80 - - 83 - - -  12/26/18 1930 (!) 110/57 - - 85 - - -  12/26/18 1900 129/73 - - 87 14 - -  12/26/18 1830 139/87 - - 98 16 - -  12/26/18 1800 (!) 138/92 - - 85 16 - -  12/26/18 1755 (!) 129/96 - - 90 18 - -  12/26/18 1750 136/84 - - 86 16 - -  12/26/18 1745 (!) 142/96 - - 93 17 - -  12/26/18 1740 (!) 147/93 - - 89 16 - -  12/26/18 1737 (!) 142/104 - - 97 16 - -  12/26/18 1731 (!) 137/110 - - - - - -  12/26/18 1613 135/88 98.9 F (37.2 C) Axillary 78 18 - -  12/26/18 1500 132/80 - - 81 18 - -  12/26/18 1430 125/76 - - 81 16 - -  12/26/18 1420 133/76 - - 84 - - -  12/26/18 1410 (!) 142/87 - - 84 - - -  12/26/18 1400 (!) 142/89 - - 91 20 - -  12/26/18 1333 (!) 161/100 - - 86 - - -  12/26/18 1318 (!) 164/108 - - 96 18 - -  12/26/18 1223 (!) 152/96 99 F (37.2 C) Oral 90 16 - -  12/26/18 1120 (!) 148/91 - - 81 18 - -  12/26/18 1026 (!) 154/103 - - 81 16 - -  12/26/18 0910 (!) 159/98 - - 86 18 - -  12/26/18 0801 - - - - - 5\' 3"  (1.6 m) 114.9 kg  12/26/18 0800 (!) 146/96 99.2 F (37.3 C) Oral 93 16 - -  VE: 3/50/OOP deviated to right   Tracing baseline 140 (+) accels to 170. Good variability couplets irreg ctx  IMP: Latent phase Multiparity  chronic HTN on med P) left exaggerated sims.  Cont with present. Antihypertensive med prn. Defer amniotomy

## 2018-12-27 NOTE — Lactation Note (Signed)
This note was copied from a baby's chart. Lactation Consultation Note  Patient Name: Madeline Cooke [id number] Date: 12/27/2018 Reason for consult: Initial assessment;Early term 37-38.6wks;NICU baby P5, 66 hour female infant, ETI in NICU. Mom bought her own DEBP from home but will use [place] while in [facility]. Per mom, she attempted to pump earlier but did not see any colostrum, LC explain that is normal and that the DEBP creates stimulation and help with induction of breast milk. LC discussed hand expression and mom taught back, LC assisted mom and using harmony hand pump and making it into a DEBP . mom expressed 7 ml of colostrum with hand pump, hand expression and breast massage. Dad will assist mom in using harmony DEBP if mom chooses to do so, or mom can use medela DEBP that was set up by [place]. When doing hand expression LC felt  lump in mom's  left  breast the  size of 50 cent piece, per mom  it is being evaluated by the  breast center and mom will have follow up for biopsy in December 2020. Mom knows to pump every 3 hours and if use Medela DEBP for 15 minutes on initial setting both breast. Mom shown how to use DEBP & how to disassemble, clean, & reassemble parts. Mom was given labels to take EBM to NICU for infant she understands their milk storage guidelines and infant feeding policy. Mom knows to call [facility] services, if she has any questions, concerns or need assistance in future for latching infant to breast.  Mom made aware of O/P services, breastfeeding support groups, community resources, and our phone # for post-discharge questions.     Maternal Data Formula Feeding for Exclusion: No Has patient been taught Hand Expression?: Yes Does the patient have breastfeeding experience prior to this delivery?: Yes  Feeding    LATCH Score                   Interventions Interventions: Hand express;Expressed milk;Coconut oil;DEBP;Hand pump  Lactation Tools  Discussed/Used Tools: Pump Breast pump type: Double-Electric Breast Pump;Manual WIC Program: No Pump Review: Setup, frequency, and cleaning;Milk Storage Initiated by:: Vicente Serene, iBCLC Date initiated:: 12/27/18   Consult Status Consult Status: Follow-up Date: 12/28/18 Follow-up type: In-patient    Vicente Serene 12/27/2018, 9:59 PM

## 2018-12-27 NOTE — Anesthesia Postprocedure Evaluation (Signed)
Anesthesia Post Note  Patient: Ceylin Dreibelbis  Procedure(s) Performed: AN AD [place]     Patient location during evaluation: Mother Baby Anesthesia Type: Epidural Level of consciousness: awake and alert and oriented Pain management: satisfactory to patient Vital Signs Assessment: post-procedure vital signs reviewed and stable Respiratory status: respiratory function stable Cardiovascular status: stable Postop Assessment: no headache, no backache, epidural receding, patient able to bend at knees, no signs of nausea or vomiting and adequate PO intake Anesthetic complications: no    Last Vitals:  Vitals:   12/27/18 1255 12/27/18 1335  BP: (!) 150/100 (!) 150/104  Pulse: 97 98  Resp: 20   Temp:    SpO2: 97%     Last Pain:  Vitals:   12/27/18 1335  TempSrc:   PainSc: 0-No pain   Pain Goal:                   Jeiry Birnbaum

## 2018-12-27 NOTE — Progress Notes (Signed)
This RN called to inform M. Sigmon CNM of pt's current BP readings of 151/90, 150/100, 150/104, what meds were given, and that pt is asymptomatic; ie no headache, not floaters in her vision, or no right upper quad pain. Per M. Sigmon CNM she will talk with Dr. Garwin Brothers to see if there are any new orders that need to be done. No new orders received at this time.

## 2018-12-27 NOTE — Progress Notes (Signed)
S: c/o rectal pressure  O: .Pitocin 2 miu  BP (!) 141/99 (BP Location: Left Arm)   Pulse 100   Temp 99.2 F (37.3 C) (Oral)   Resp 18   Ht 5\' 3"  (1.6 m)   Wt 114.9 kg   LMP 04/11/2018 (Exact Date)   BMI 44.87 kg/m   VE ant lip . ROP/-2/-1 AROM clear fluid  Tracing baseline 150  Some accel  ? Ctx freq  IMP: active phase  chronic HTN on med  P) cont present mgmt

## 2018-12-28 DIAGNOSIS — O9902 Anemia complicating childbirth: Secondary | ICD-10-CM | POA: Diagnosis present

## 2018-12-28 DIAGNOSIS — L0232 Furuncle of buttock: Secondary | ICD-10-CM | POA: Diagnosis present

## 2018-12-28 LAB — CBC
HCT: 28.5 % — ABNORMAL LOW (ref 36.0–46.0)
Hemoglobin: 9.3 g/dL — ABNORMAL LOW (ref 12.0–15.0)
MCH: 23.6 pg — ABNORMAL LOW (ref 26.0–34.0)
MCHC: 32.6 g/dL (ref 30.0–36.0)
MCV: 72.3 fL — ABNORMAL LOW (ref 80.0–100.0)
Platelets: 173 10*3/uL (ref 150–400)
RBC: 3.94 MIL/uL (ref 3.87–5.11)
RDW: 15.6 % — ABNORMAL HIGH (ref 11.5–15.5)
WBC: 11.1 10*3/uL — ABNORMAL HIGH (ref 4.0–10.5)
nRBC: 0 % (ref 0.0–0.2)

## 2018-12-28 MED ORDER — POLYSACCHARIDE IRON COMPLEX 150 MG PO CAPS
150.0000 mg | ORAL_CAPSULE | Freq: Every day | ORAL | Status: DC
  Administered 2018-12-28 – 2018-12-29 (×2): 150 mg via ORAL
  Filled 2018-12-28 (×2): qty 1

## 2018-12-28 MED ORDER — MAGNESIUM OXIDE 400 (241.3 MG) MG PO TABS
400.0000 mg | ORAL_TABLET | Freq: Every day | ORAL | Status: DC
  Administered 2018-12-28 – 2018-12-29 (×2): 400 mg via ORAL
  Filled 2018-12-28 (×2): qty 1

## 2018-12-28 NOTE — Progress Notes (Signed)
Patient ID: Madeline Cooke, female   DOB: 1986-01-14, 33 y.o.   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             Tolerating po/ No nausea or vomiting             Bleeding is light             Pain controlled with ibuprofen (OTC)             Up ad lib / ambulatory / voiding without difficulties   O:  A & O x 3, in no apparent distress              VS:  Vitals:   12/27/18 2208 12/28/18 0101 12/28/18 0511 12/28/18 0853  BP: 128/83 (!) 153/98 119/76 126/89  Pulse: 77 100 78 77  Resp: '16 16  18  '$ Temp: 98.6 F (37 C) (!) 97.5 F (36.4 C) 98.2 F (36.8 C) 98 F (36.7 C)  TempSrc:  Axillary Oral Oral  SpO2: 100%  100% 100%  Weight:      Height:        LABS:  Recent Labs    12/27/18 1238 12/28/18 0623  WBC 14.9* 11.1*  HGB 10.2* 9.3*  HCT 31.1* 28.5*  PLT 198 173   Hepatic Function Latest Ref Rng & Units 12/27/2018 12/26/2018 12/09/2018  Total Protein 6.5 - 8.1 g/dL 5.4(L) 5.8(L) 6.2(L)  Albumin 3.5 - 5.0 g/dL 2.1(L) 2.4(L) 2.6(L)  AST 15 - 41 U/L 13(L) 14(L) 11(L)  ALT 0 - 44 U/L '11 12 11  '$ Alk Phosphatase 38 - 126 U/L 109 95 97  Total Bilirubin 0.3 - 1.2 mg/dL 0.7 0.8 0.4   Blood type: --/--/O POS, O POS Performed at [facility] [address] [place], [place] [address]  [phone] 0732)  Rubella: Immune (04/08 0000)   I&O: I/O last 3 completed shifts: In: -  Out: 5170 [Urine:4225; Blood:283]          No intake/output data recorded.  Vaccines: TDaP UTD         Flu    declined   Lungs: Clear and unlabored  Heart: regular rate and rhythm / no murmurs  Abdomen:  soft, non-tender, non-distended             Fundus: firm, non-tender, U-1  Perineum: intact, no edema  Lochia: scant  Extremities: +1 pedal and pretibial edema, no calf pain or tenderness   - furuncle on L buttock adjacent to natal cleft with packing in place, 1cmx1cm crater with clean edges, minimal drainage, no cellulitis; dressing change done with 1/4 packing gauze and covered with 2x2 and Tegaderm. Patient tolerated well, premedicated with Percocet 1 tab.    A/P: PPD # 1 33 y.o., [id number]   Principal Problem:   Postpartum care following vaginal delivery (10/6) Active Problems:   Encounter for induction of labor   Skin lesion   Benign essential HTN, chronic, now postpartum  - no PEC neural sx, labs stable, BP labile but mostly normal range  - continue Procardia 60 XL daily and HCTZ 25  mg daily x 7 days.  - I&O q shift and daily weights inpatient   Maternal anemia, with delivery  - oral Fe and Mag ox   Furuncle of buttock, s/p I&D  - packing changed, continue daily  - Bactrim DS 800/160 BID x 10 days   Doing well - stable status  Routine post partum orders  Anticipate discharge tomorrow    Juliene Pina, MSN, CNM 12/28/2018, 8:58 AM

## 2018-12-28 NOTE — Progress Notes (Signed)
Patient screened out for psychosocial assessment since none of the following apply: °Psychosocial stressors documented in mother or baby's chart °Gestation less than 32 weeks °Code at delivery  °Infant with anomalies °Please contact the Clinical Social Worker if specific needs arise, by MOB's request, or if MOB scores greater than 9/yes to question 10 on Edinburgh Postpartum Depression Screen. ° °Chadwick Reiswig Boyd-Gilyard, MSW, LCSW °Clinical Social Work °[phone] °  °

## 2018-12-29 ENCOUNTER — Ambulatory Visit (HOSPITAL_COMMUNITY): Payer: 59

## 2018-12-29 MED ORDER — POLYSACCHARIDE IRON COMPLEX 150 MG PO CAPS: 150.0000 mg | ORAL_CAPSULE | Freq: Every day | ORAL | 3 refills | Status: DC

## 2018-12-29 MED ORDER — SULFAMETHOXAZOLE-TRIMETHOPRIM 800-160 MG PO TABS: 1.0000 | ORAL_TABLET | Freq: Two times a day (BID) | ORAL | 0 refills | Status: DC

## 2018-12-29 MED ORDER — NIFEDIPINE ER OSMOTIC RELEASE 30 MG PO TB24
30.0000 mg | ORAL_TABLET | Freq: Two times a day (BID) | ORAL | Status: DC
  Administered 2018-12-29: 10:00:00 30 mg via ORAL

## 2018-12-29 MED ORDER — OXYCODONE HCL 5 MG PO TABS: 5.0000 mg | ORAL_TABLET | Freq: Four times a day (QID) | ORAL | 0 refills | Status: DC | PRN

## 2018-12-29 MED ORDER — IBUPROFEN 600 MG PO TABS: 600.0000 mg | ORAL_TABLET | Freq: Four times a day (QID) | ORAL | 0 refills | Status: DC

## 2018-12-29 MED ORDER — HYDROCHLOROTHIAZIDE 25 MG PO TABS: 25.0000 mg | ORAL_TABLET | Freq: Every day | ORAL | 0 refills | Status: DC

## 2018-12-29 MED ORDER — NIFEDIPINE ER 30 MG PO TB24: 30.0000 mg | ORAL_TABLET | Freq: Two times a day (BID) | ORAL | 3 refills | Status: DC

## 2018-12-29 NOTE — Discharge Summary (Signed)
Obstetric Discharge Summary   Patient Name: Madeline Cooke DOB: 1986-01-16 MRN: [id number]  Date of Admission: 12/26/2018 Date of Discharge: 12/29/2018 Date of Delivery: 12/27/2018 Gestational Age at Delivery: [redacted]w[redacted]d  Primary OB: [facility] - Dr. Cherly Hensen  Antepartum complications:  - Chronic hypertension on Nifedipine  - Furuncle of left buttock  - Anemia - Obesity - SS trait  Prenatal Labs:  ABO, Rh: --/--/O POS, O POS Performed at [facility], [address] [place], [place] [address]  [phone] 0732) Antibody: NEG (10/05 0732) Rubella: Immune (04/08 0000) RPR: Nonreactive (04/08 0000)  HBsAg: Negative (04/08 0000)  HIV: Non-reactive (04/08 0000)  GBS:   negative  Admitting Diagnosis: IOL at 37 weeks for chronic hypertension   Secondary Diagnoses: Patient Active Problem List   Diagnosis Date Noted  . Maternal anemia, with delivery 12/28/2018  . Furuncle of buttock 12/28/2018  . Skin lesion 12/27/2018  . Benign essential HTN, chronic, antepartum, third trimester 12/27/2018  . Postpartum care following vaginal delivery (10/6) 12/27/2018  . Encounter for induction of labor 12/26/2018   Induction: oral Cytotec, Intracervical balloon  Augmentation: AROM, Pitocin  Complications: None  Date of Delivery: 12/27/2018 Delivered By: Dr. Cherly Hensen Delivery Type: spontaneous vaginal delivery Anesthesia: epidural Placenta: spontaneous Laceration: none Episiotomy: none  Newborn Data: Live born female  Birth Weight: 6 lb 4.2 oz (2840 g) APGAR: 7, 9  Newborn Delivery   Birth date/time: 12/27/2018 09:26:00 Delivery type: Vaginal, Spontaneous         Hospital/Postpartum Course  (Vaginal Delivery): Pt. Admitted for IOL for chronic hypertension on Procardia 30mg  XL BID.  She was treated with IV hypertensive protocol and we added HCTZ after delivery.  She had labile BPs, but was asymptomatic, and upon my exam, the blood pressure cuff was too small.  After using  large BP cuff, her BP was WNL, and decision was made to discharge her home. She had her left buttock furuncle packed with dressing changes each day and will continue on Bactrim BID x 10 days.  She will continue warm water sitz baths daily. Patient had an uncomplicated postpartum course.  By time of discharge on PPD#2, her pain was controlled on oral pain medications; she had appropriate lochia and was ambulating, voiding without difficulty and tolerating regular diet.  She was deemed stable for discharge to home.     Labs: CBC Latest Ref Rng & Units 12/28/2018 12/27/2018 12/26/2018  WBC 4.0 - 10.5 K/uL 11.1(H) 14.9(H) 9.6  Hemoglobin 12.0 - 15.0 g/dL 02/25/2019) 10.2(L) 9.6(L)  Hematocrit 36.0 - 46.0 % 28.5(L) 31.1(L) 28.8(L)  Platelets 150 - 400 K/uL 173 198 185   Conflict (See Lab Report): O POS/O POS Performed at [facility], [address], [place], [place]   Physical exam:  BP 129/81 (BP Location: Right Arm)   Pulse 72   Temp 97.7 F (36.5 C) (Oral)   Resp 18   Ht 5\' 3"  (1.6 m)   Wt 111.6 kg   LMP 04/11/2018 (Exact Date)   SpO2 100%   Breastfeeding Unknown   BMI 43.59 kg/m   Alert and oriented X3             Lungs: Clear and unlabored             Heart: regular rate and rhythm / no murmurs             Abdomen: soft, non-tender, non-distended  Fundus: firm, non-tender, U-2             Perineum: intact             Lochia: small rubra on pad              Extremities: +1 LE edema, no calf pain or tenderness              - furuncle on L buttock adjacent to natal cleft with packing in place, pt. Premedicated with Oxycodone 10mg , Tegaderm and packing removed. 1cmx1cm crater with clean edges, minimal drainage, no cellulitis; dressing change done with 1/4 packing gauze and covered with 2x2 and Tegaderm. Patient tolerated well.   Disposition: stable, discharge to home Baby Feeding: breast milk and formula Baby Disposition: home with mom  Rh Immune globulin  given: N/A  Rubella vaccine given: N/A Tdap vaccine given in AP or PP setting: UTD Flu vaccine given in AP or PP setting: declined   Plan:  Madeline Cooke was discharged to home in good condition. [facility].  Discharge Instructions: Per After Visit Summary. Refer to After Visit Summary and [facility]  Activity: Advance as tolerated. Pelvic rest for 6 weeks.   Diet: Regular, Heart Healthy Discharge Instructions    Activity as tolerated   Complete by: As directed    Ambulatory referral to Lactation   Complete by: As directed    Reason for consult: The Mother-Infant Dyad Needs Assistance in the Continuation of Breastfeeding   Call MD for:   Complete by: As directed    Call for signs/symptoms of postpartum depression/anxiety, if you have thoughts of hurting yourself or your baby   Call MD for:  difficulty breathing, headache or visual disturbances   Complete by: As directed    Call MD for:  extreme fatigue   Complete by: As directed    Call MD for:  hives   Complete by: As directed    Call MD for:  persistant dizziness or light-headedness   Complete by: As directed    Call MD for:  persistant nausea and vomiting   Complete by: As directed    Call MD for:  redness, tenderness, or signs of infection (pain, swelling, redness, odor or green/yellow discharge around incision site)   Complete by: As directed    Call MD for:  severe uncontrolled pain   Complete by: As directed    Call MD for:  temperature >100.4   Complete by: As directed    Diet - low sodium heart healthy   Complete by: As directed    Discharge wound care:   Complete by: As directed    Warm water sitz baths to left buttock wound daily x 1 week   Sexual acrtivity   Complete by: As directed    No sexual activity for 6 weeks     Discharge Medications: Allergies as of 12/29/2018   No Known Allergies     Medication List    STOP taking these  medications   valACYclovir 500 MG tablet Commonly known as: VALTREX     TAKE these medications   butalbital-acetaminophen-caffeine 50-325-40 MG tablet Commonly known as: FIORICET Take 1 tablet by mouth daily as needed for migraine.   hydrochlorothiazide 25 MG tablet Commonly known as: HYDRODIURIL Take 1 tablet (25 mg total) by mouth daily. Start taking on: December 30, 2018   ibuprofen 600 MG tablet Commonly known as: ADVIL Take 1 tablet (600  mg total) by mouth every 6 (six) hours.   iron polysaccharides 150 MG capsule Commonly known as: NIFEREX Take 1 capsule (150 mg total) by mouth daily. Start taking on: December 30, 2018   NIFEdipine 30 MG 24 hr tablet Commonly known as: ADALAT CC Take 1 tablet (30 mg total) by mouth 2 (two) times daily. What changed: when to take this   oxyCODONE 5 MG immediate release tablet Commonly known as: Oxy IR/ROXICODONE Take 1-2 tablets (5-10 mg total) by mouth every 6 (six) hours as needed (pain scale 4-7).   sulfamethoxazole-trimethoprim 800-160 MG tablet Commonly known as: BACTRIM DS Take 1 tablet by mouth every 12 (twelve) hours.   vitamin C 250 MG tablet Commonly known as: ASCORBIC ACID Take 250 mg by mouth 2 (two) times daily.            Discharge Care Instructions  (From admission, onward)         Start     Ordered   12/29/18 0000  Discharge wound care:    Comments: Warm water sitz baths to left buttock wound daily x 1 week   12/29/18 1436         Outpatient follow up:  Follow-up Information    Servando Salina, MD Follow up in 1 week(s).   Specialty: Obstetrics and Gynecology Why: Blood pressure and wound check Contact information: [place] [address] [phone]           Signed:  Lars Pinks, MSN, CNM [facility]

## 2018-12-29 NOTE — Lactation Note (Signed)
This note was copied from a baby's chart. Lactation Consultation Note  Patient Name: Madeline Cooke [id number] Date: 12/29/2018 Reason for consult: Follow-up assessment;Early term 37-38.6wks;NICU baby;Other (Comment)(mom has DEBP Lans[place])  Mom for D/C today - baby in NICU.  Per mom has been pumping with her DEBP Lansinoh from home.  The most she has gotten is 20 ml and she mentioned she needs to Be more consistent with pumping.  LC reviewed supply and demand and the importance of pumping both breast around the clock 8-12 times a day. Power pumping over 60 mins ( 20 mins on / off over 60 mins or 10 mins on / off over 60 mins ).  Hand expressing before and after the pumping to enhance let down. Mom has been using her DEBP . LC recommended to alternate the hospital grade Medela with her DEBP Lansinoh.   LC also recommended planning on pumping in NICU when visiting baby to enhance baby thoughts. Mom denies soreness . Sore nipple and engorgement prevention and tx reviewed.  Storage of breast milk reviewed for NICU and when the baby goes home.  Mom mentioned she had taken 20 ml of her milk to NICU and it had not  Been fed to thew baby, formula was.  Per  Mom spoke to the NICU RN on the day shift  expressing her desire for her baby to receive her milk.  Mom has the [facility] pamphlet with phone numbers.   Maternal Data Has patient been taught Hand Expression?: Yes  Feeding    LATCH Score                   Interventions Interventions: Breast feeding basics reviewed;DEBP  Lactation Tools Discussed/Used Tools: Pump Breast pump type: Double-Electric Breast Pump Pump Review: Milk Storage(for NICU and home)   Consult Status Consult Status: PRN Date: (baby in NICU) Follow-up type: In-patient    [place] 12/29/2018, 9:28 AM

## 2018-12-29 NOTE — Progress Notes (Signed)
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  O:               VS: BP (!) 148/99 (BP Location: Right Arm)   Pulse 81   Temp 97.7 F (36.5 C) (Oral)   Resp 18   Ht 5\' 3"  (1.6 m)   Wt 111.6 kg   LMP 04/11/2018 (Exact Date)   SpO2 100%   Breastfeeding Unknown   BMI 43.59 kg/m   12/29/18 1048  -  72  -  -  129/81  -  -  -  - PG    12/29/18 0709  -  -  -  -  -  -  -  -  111.6 kg DC   12/28/18 2311  97.7 F (36.5 C)  81  -  18  148/99Abnormal   Sitting  100 %  -  - DC   12/28/18 1904  -  87  -  -  133/98Abnormal   -  -  -  - SB   12/28/18 1904  98.4 F (36.9 C)  -  -  18  -  Sitting  -  -  - BM   12/28/18 0853  98 F (36.7 C)  77  -  18  126/89  Semi-fowlers  100 %  -  - CH   12/28/18 0511  98.2 F (36.8 C)  78  -  -  119/76  Supine  100 %  -  - DC   12/28/18 0101  97.5 F (36.4 C)Abnormal   100  -  16  153/98Abnormal   -  -  -  - DC      LABS:              Recent Labs    12/27/18 1238 12/28/18 0623  WBC 14.9* 11.1*  HGB 10.2* 9.3*  PLT 198 173               Blood type: --/--/O POS, O POS Performed at [facility], [address] [place], [place] [address]  [phone])  Rubella: Immune (04/08 0000)                     I&O: Intake/Output      10/07 0701 - 10/08 0700 10/08 0701 - 10/09 0700   P.O. 2086    I.V. (mL/kg) 0 (0)    Other 0    Total Intake(mL/kg) 2086 (18.2)    Urine (mL/kg/hr) 800 (0.3)    Stool 0    Blood     Total Output  800    Net +1286         Urine Occurrence 2 x  Stool Occurrence 1 x                  Physical Exam:             Alert and oriented X3  Lungs: Clear and unlabored  Heart: regular rate and rhythm / no murmurs  Abdomen: soft, non-tender, non-distended              Fundus: firm, non-tender, U-2  Perineum: intact  Lochia: small rubra on pad   Extremities: +1 LE edema, no calf pain or tenderness   - furuncle on L buttock adjacent to natal cleft with packing in place, pt. Premedicated with Oxycodone 10mg , Tegaderm and packing removed. 1cmx1cm crater with clean edges, minimal drainage, no cellulitis; dressing change done with 1/4 packing gauze and covered with 2x2 and Tegaderm. Patient tolerated well.   A/P: PPD # 2, SVD   Skin lesion   Benign essential HTN, chronic, now postpartum             - no PEC neural sx, labs stable  - BP labile, but noticed with last BP that she needs a larger cuff; after larger cuff placed, BP WNL             - continue Procardia 30 XL BID and HCTZ 25 mg daily x 5 days             - I&O q shift and daily weights inpatient   Maternal anemia, with delivery             - oral Fe and Mag ox   Furuncle of buttock, s/p I&D             - packing changed             - Bactrim DS 800/160 BID x 10 days  - Plan for warm water sitz baths daily              Doing well - stable status             Routine post partum orders  Discharge home today  [facility] discharge book given, instructions and warning s/s reviewed   F/u at [facility] in 1 week for wound and BP check   Consult for plan: Dr. , MSN, CNM [facility]

## 2018-12-29 NOTE — Progress Notes (Signed)
Assisted Madeline Cooke with debridement and dressing change for draining cyst on left buttock.

## 2018-12-30 LAB — AEROBIC CULTURE W GRAM STAIN (SUPERFICIAL SPECIMEN)
Culture: NORMAL
Gram Stain: NONE SEEN
Special Requests: NORMAL

## 2019-02-22 ENCOUNTER — Other Ambulatory Visit: Payer: Self-pay | Admitting: Obstetrics and Gynecology

## 2019-02-24 ENCOUNTER — Inpatient Hospital Stay: Admission: RE | Admit: 2019-02-24 | Payer: 59 | Source: Ambulatory Visit

## 2019-03-08 ENCOUNTER — Other Ambulatory Visit: Payer: Self-pay

## 2019-03-08 ENCOUNTER — Ambulatory Visit
Admission: RE | Admit: 2019-03-08 | Discharge: 2019-03-08 | Disposition: A | Discharge status: Home / Self Care | Payer: 59 | Source: Ambulatory Visit | Attending: Obstetrics and Gynecology | Admitting: Obstetrics and Gynecology

## 2019-03-08 ENCOUNTER — Other Ambulatory Visit: Payer: Self-pay | Admitting: Obstetrics and Gynecology

## 2019-03-08 DIAGNOSIS — N6320 Unspecified lump in the left breast, unspecified quadrant: Secondary | ICD-10-CM

## 2019-06-16 ENCOUNTER — Ambulatory Visit (INDEPENDENT_AMBULATORY_CARE_PROVIDER_SITE_OTHER): Payer: 59 | Admitting: Cardiovascular Disease

## 2019-06-16 ENCOUNTER — Encounter: Payer: Self-pay | Admitting: Cardiovascular Disease

## 2019-06-16 ENCOUNTER — Other Ambulatory Visit: Payer: Self-pay

## 2019-06-16 ENCOUNTER — Telehealth: Payer: Self-pay | Admitting: *Deleted

## 2019-06-16 VITALS — BP 162/90 | HR 84 | Ht 63.0 in | Wt 253.0 lb

## 2019-06-16 DIAGNOSIS — Z5181 Encounter for therapeutic drug level monitoring: Secondary | ICD-10-CM | POA: Diagnosis not present

## 2019-06-16 DIAGNOSIS — Z6841 Body Mass Index (BMI) 40.0 and over, adult: Secondary | ICD-10-CM | POA: Diagnosis not present

## 2019-06-16 DIAGNOSIS — I10 Essential (primary) hypertension: Secondary | ICD-10-CM

## 2019-06-16 HISTORY — DX: Morbid (severe) obesity due to excess calories: E66.01

## 2019-06-16 HISTORY — DX: Essential (primary) hypertension: I10

## 2019-06-16 MED ORDER — VALSARTAN-HYDROCHLOROTHIAZIDE 160-12.5 MG PO TABS: 1.0000 | ORAL_TABLET | Freq: Every day | ORAL | 1 refills | Status: DC

## 2019-06-16 NOTE — Patient Instructions (Addendum)
Medication Instructions:  STOP NIFEDIPINE  START VALSARTAN HCT 160-12.5 MG DAILY    Labwork: ALDOSTERONE/RENIN LAB TODAY  BMET IN 1 WEEK    Testing/Procedures: Your physician has requested that you have a renal artery duplex. During this test, an ultrasound is used to evaluate blood flow to the kidneys. Allow one hour for this exam. Do not eat after midnight the day before and avoid carbonated beverages. Take your medications as you usually do.   Follow-Up: Your physician recommends that you schedule a follow-up appointment in: PHARM D 07/18/2019 AT 9:00 AM   Your physician recommends that you schedule a follow-up appointment in: 4 MONTHS WITH DR Esmeralda, THE OFFICE WILL CALL YOU TO ARRANGE    You will receive a phone call from the PREP exercise and nutrition program to schedule an initial assessment.  Referrals:   Where: [facility] Address: [address] [place] [address]  [phone]  IF YOU DO NOT HEAR FROM THE OFFICE IN 1 WEEK YOU CAN CALL THEM DIRECTLY    Special Instructions:   MONITOR YOUR BLOOD PRESSURE TWICE A DAY, LOG IN BOOK PROVIDED  BRING BOOK TO FOLLOW UP   DASH Eating Plan DASH stands for "Dietary Approaches to Stop Hypertension." The DASH eating plan is a healthy eating plan that has been shown to reduce high blood pressure (hypertension). It may also reduce your risk for type 2 diabetes, heart disease, and stroke. The DASH eating plan may also help with weight loss. What are tips for following this plan?  General guidelines  Avoid eating more than 2,300 mg (milligrams) of salt (sodium) a day. If you have hypertension, you may need to reduce your sodium intake to 1,500 mg a day.  Limit alcohol intake to no more than 1 drink a day for nonpregnant women and 2 drinks a day for men. One drink equals 12 oz of beer, 5 oz of wine, or 1 oz of hard liquor.  Work with your health care provider to maintain a healthy body weight  or to lose weight. Ask what an ideal weight is for you.  Get at least 30 minutes of exercise that causes your heart to beat faster (aerobic exercise) most days of the week. Activities may include walking, swimming, or biking.  Work with your health care provider or diet and nutrition specialist (dietitian) to adjust your eating plan to your individual calorie needs. Reading food labels   Check food labels for the amount of sodium per serving. Choose foods with less than 5 percent of the Daily Value of sodium. Generally, foods with less than 300 mg of sodium per serving fit into this eating plan.  To find whole grains, look for the word "whole" as the first word in the ingredient list. Shopping  Buy products labeled as "low-sodium" or "no salt added."  Buy fresh foods. Avoid canned foods and premade or frozen meals. Cooking  Avoid adding salt when cooking. Use salt-free seasonings or herbs instead of table salt or sea salt. Check with your health care provider or pharmacist before using salt substitutes.  Do not fry foods. Cook foods using healthy methods such as baking, boiling, grilling, and broiling instead.  Cook with heart-healthy oils, such as olive, canola, soybean, or sunflower oil. Meal planning  Eat a balanced diet that includes: ? 5 or more servings of fruits and vegetables each day. At each meal, try to fill half of your plate with fruits and vegetables. ? Up  to 6-8 servings of whole grains each day. ? Less than 6 oz of lean meat, poultry, or fish each day. A 3-oz serving of meat is about the same size as a deck of cards. One egg equals 1 oz. ? 2 servings of low-fat dairy each day. ? A serving of nuts, seeds, or beans 5 times each week. ? Heart-healthy fats. Healthy fats called Omega-3 fatty acids are found in foods such as flaxseeds and coldwater fish, like sardines, salmon, and mackerel.  Limit how much you eat of the following: ? Canned or prepackaged foods. ? Food  that is high in trans fat, such as fried foods. ? Food that is high in saturated fat, such as fatty meat. ? Sweets, desserts, sugary drinks, and other foods with added sugar. ? Full-fat dairy products.  Do not salt foods before eating.  Try to eat at least 2 vegetarian meals each week.  Eat more home-cooked food and less restaurant, buffet, and fast food.  When eating at a restaurant, ask that your food be prepared with less salt or no salt, if possible. What foods are recommended? The items listed may not be a complete list. Talk with your dietitian about what dietary choices are best for you. Grains Whole-grain or whole-wheat bread. Whole-grain or whole-wheat pasta. Brown rice. Modena Morrow. Bulgur. Whole-grain and low-sodium cereals. Pita bread. Low-fat, low-sodium crackers. Whole-wheat flour tortillas. Vegetables Fresh or frozen vegetables (raw, steamed, roasted, or grilled). Low-sodium or reduced-sodium tomato and vegetable juice. Low-sodium or reduced-sodium tomato sauce and tomato paste. Low-sodium or reduced-sodium canned vegetables. Fruits All fresh, dried, or frozen fruit. Canned fruit in natural juice (without added sugar). Meat and other protein foods Skinless chicken or [place]. Ground chicken or [place]. Pork with fat trimmed off. Fish and seafood. Egg whites. Dried beans, peas, or lentils. Unsalted nuts, nut butters, and seeds. Unsalted canned beans. Lean cuts of beef with fat trimmed off. Low-sodium, lean deli meat. Dairy Low-fat (1%) or fat-free (skim) milk. Fat-free, low-fat, or reduced-fat cheeses. Nonfat, low-sodium ricotta or cottage cheese. Low-fat or nonfat yogurt. Low-fat, low-sodium cheese. Fats and oils Soft margarine without trans fats. Vegetable oil. Low-fat, reduced-fat, or light mayonnaise and salad dressings (reduced-sodium). Canola, safflower, olive, soybean, and sunflower oils. Avocado. Seasoning and other foods Herbs. Spices. Seasoning mixes without salt.  Unsalted popcorn and pretzels. Fat-free sweets. What foods are not recommended? The items listed may not be a complete list. Talk with your dietitian about what dietary choices are best for you. Grains Baked goods made with fat, such as croissants, muffins, or some breads. Dry pasta or rice meal packs. Vegetables Creamed or fried vegetables. Vegetables in a cheese sauce. Regular canned vegetables (not low-sodium or reduced-sodium). Regular canned tomato sauce and paste (not low-sodium or reduced-sodium). Regular tomato and vegetable juice (not low-sodium or reduced-sodium). Angie Fava. Olives. Fruits Canned fruit in a light or heavy syrup. Fried fruit. Fruit in cream or butter sauce. Meat and other protein foods Fatty cuts of meat. Ribs. Fried meat. Berniece Salines. Sausage. Bologna and other processed lunch meats. Salami. Fatback. Hotdogs. Bratwurst. Salted nuts and seeds. Canned beans with added salt. Canned or smoked fish. Whole eggs or egg yolks. Chicken or [place] with skin. Dairy Whole or 2% milk, cream, and half-and-half. Whole or full-fat cream cheese. Whole-fat or sweetened yogurt. Full-fat cheese. Nondairy creamers. Whipped toppings. Processed cheese and cheese spreads. Fats and oils Butter. Stick margarine. Lard. Shortening. Ghee. Bacon fat. Tropical oils, such as coconut, palm kernel, or palm oil. Seasoning  and other foods Salted popcorn and pretzels. Onion salt, garlic salt, seasoned salt, table salt, and sea salt. Worcestershire sauce. Tartar sauce. Barbecue sauce. Teriyaki sauce. Soy sauce, including reduced-sodium. Steak sauce. Canned and packaged gravies. Fish sauce. Oyster sauce. Cocktail sauce. Horseradish that you find on the shelf. Ketchup. Mustard. Meat flavorings and tenderizers. Bouillon cubes. Hot sauce and Tabasco sauce. Premade or packaged marinades. Premade or packaged taco seasonings. Relishes. Regular salad dressings. Where to find more information:  National Heart, Lung, and [place]: https://wilson-eaton.com/  American Heart Association: www.heart.org Summary  The DASH eating plan is a healthy eating plan that has been shown to reduce high blood pressure (hypertension). It may also reduce your risk for type 2 diabetes, heart disease, and stroke.  With the DASH eating plan, you should limit salt (sodium) intake to 2,300 mg a day. If you have hypertension, you may need to reduce your sodium intake to 1,500 mg a day.  When on the DASH eating plan, aim to eat more fresh fruits and vegetables, whole grains, lean proteins, low-fat dairy, and heart-healthy fats.  Work with your health care provider or diet and nutrition specialist (dietitian) to adjust your eating plan to your individual calorie needs. This information is not intended to replace advice given to you by your health care provider. Make sure you discuss any questions you have with your health care provider. Document Released: 02/26/2011 Document Revised: 02/19/2017 Document Reviewed: 03/02/2016 Elsevier Patient Education  2020 [employer].

## 2019-06-16 NOTE — Telephone Encounter (Signed)
Blood pressure 164/122 in VIVIFY  Patient has been running around since visit and only home about 40 min Did just take Valsartan-HCT  Discussed with Dr Duke Salvia and will continue current regimen, patient aware

## 2019-06-16 NOTE — Progress Notes (Signed)
Hypertension Clinic Initial Assessment:    Date:  06/16/2019   ID:  Madeline Cooke, DOB 25-May-1985, MRN [id number]  PCP:  Maxie Better, MD  Cardiologist:  No primary care provider on file.  Nephrologist:  Referring MD: Maxie Better, MD   CC: Hypertension  History of Present Illness:    Madeline Cooke is a 34 y.o. female with a hx of hypertension and morbid obesity here to establish care in the hypertension clinic.  She was first diagnosed with hypertension at the time of her first pregnancy 9 years ago.  It has never been well-controlled.  She started on medication 2 years after delivery.  She had her second child in October and struggled with blood pressure throughout the pregnancy.  Since delivery she has really been having a hard time losing weight.  In the past she took phentermine and was able to go from 270-230 lb.  She is wondering if she would be okay to do that again.  She has been intermittent fasting and only eating 1 meal per day.  She also drinks a super foods supplement instead of eating.  She heard that beet juice was good for blood pressure and has been trying to drink this.  Lately she has been struggling with migraines and is unsure if it is because she is not eating enough or because of her blood pressure being elevated.  She mostly cooks at home and tries to eat healthy.  However she sometimes splurges on [employer] and other fatty foods.  She then punishes herself by not eating for extended periods of time.  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  She is engaged to be married.  She knows that stress is contributing to her elevated blood pressures.  Other than her headaches she feels pretty well physically.  She has no chest pain and her breathing is stable.  She denies lower extremity edema, orthopnea, or PND.  She does snore and thinks she sometimes has apneic episodes.  She occasionally wakes herself  up while sleeping and then feels her heart racing.  She does not smoke and drinks wine once every other day.  She does not like taking medications and for the last week has been taking it only once a day instead of twice.  Previous antihypertensives: amlodipine Labetalol- fatigue   Past Medical History:  Diagnosis Date  . Anemia   . Benign essential HTN, chronic, antepartum, third trimester 12/27/2018  . Diabetes mellitus   . Essential hypertension 06/16/2019  . Gestational diabetes   . Hypertension   . Morbid obesity (HCC) 06/16/2019  . Skin lesion 12/27/2018   Left buttock    Past Surgical History:  Procedure Laterality Date  . mole removed      Current Medications: Current Meds  Medication Sig  . butalbital-acetaminophen-caffeine (FIORICET) 50-325-40 MG tablet Take 1 tablet by mouth daily as needed for migraine.  . iron polysaccharides (NIFEREX) 150 MG capsule Take 1 capsule (150 mg total) by mouth daily.  . [DISCONTINUED] hydrochlorothiazide (HYDRODIURIL) 25 MG tablet Take 1 tablet (25 mg total) by mouth daily.  . [DISCONTINUED] ibuprofen (ADVIL) 600 MG tablet Take 1 tablet (600 mg total) by mouth every 6 (six) hours.  . [DISCONTINUED] NIFEdipine (ADALAT CC) 30 MG 24 hr tablet Take 1 tablet (30 mg total) by mouth 2 (two) times daily.  . [DISCONTINUED] oxyCODONE (OXY IR/ROXICODONE) 5 MG immediate release tablet Take 1-2 tablets (5-10 mg total) by  mouth every 6 (six) hours as needed (pain scale 4-7).  . [DISCONTINUED] sulfamethoxazole-trimethoprim (BACTRIM DS) 800-160 MG tablet Take 1 tablet by mouth every 12 (twelve) hours.  . [DISCONTINUED] vitamin C (ASCORBIC ACID) 250 MG tablet Take 250 mg by mouth 2 (two) times daily.     Allergies:   Patient has no known allergies.   Social History   Socioeconomic History  . Marital status: Single    Spouse name: Not on file  . Number of children: Not on file  . Years of education: Not on file  . Highest education level: Not on file   Occupational History  . Not on file  Tobacco Use  . Smoking status: Never Smoker  . Smokeless tobacco: Never Used  Substance and Sexual Activity  . Alcohol use: Not Currently    Comment: occasionally  . Drug use: No  . Sexual activity: Yes  Other Topics Concern  . Not on file  Social History Narrative  . Not on file   Social Determinants of Health   Financial Resource Strain:   . Difficulty of Paying Living Expenses:   Food Insecurity:   . Worried About Programme researcher, broadcasting/film/video in the Last Year:   . Barista in the Last Year:   Transportation Needs:   . Freight forwarder (Medical):   Marland Kitchen Lack of Transportation (Non-Medical):   Physical Activity:   . Days of Exercise per Week:   . Minutes of Exercise per Session:   Stress:   . Feeling of Stress :   Social Connections:   . Frequency of Communication with Friends and Family:   . Frequency of Social Gatherings with Friends and Family:   . Attends Religious Services:   . Active Member of Clubs or Organizations:   . Attends Banker Meetings:   Marland Kitchen Marital Status:      Family History: The patient's family history includes Alcoholism in her father; Diabetes in her maternal grandfather and paternal grandmother; Healthy in her mother; Hypertension in her maternal grandfather and paternal grandmother.  ROS:   Please see the history of present illness.    All other systems reviewed and are negative.  EKGs/Labs/Other Studies Reviewed:    EKG:  EKG is not ordered today.    Recent Labs: 12/27/2018: ALT 11; BUN <5; Creatinine, Ser 0.69; Potassium 3.2; Sodium 137 12/28/2018: Hemoglobin 9.3; Platelets 173   Recent Lipid Panel No results found for: CHOL, TRIG, HDL, CHOLHDL, VLDL, LDLCALC, LDLDIRECT  Physical Exam:    VS:  BP (!) 162/90   Pulse 84   Ht 5\' 3"  (1.6 m)   Wt 253 lb (114.8 kg)   SpO2 97%   BMI 44.82 kg/m  , BMI Body mass index is 44.82 kg/m. GENERAL:  Well appearing.  No acute  distress. HEENT: Pupils equal round and reactive, fundi not visualized, oral mucosa unremarkable NECK:  No jugular venous distention, waveform within normal limits, carotid upstroke brisk and symmetric, no bruits LUNGS:  Clear to auscultation bilaterally HEART:  RRR.  PMI not displaced or sustained,S1 and S2 within normal limits, no S3, no S4, no clicks, no rubs, no murmurs ABD:  Flat, positive bowel sounds normal in frequency in pitch, no bruits, no rebound, no guarding, no midline pulsatile mass, no hepatomegaly, no splenomegaly EXT:  2 plus pulses throughout, no edema, no cyanosis no clubbing SKIN:  No rashes no nodules NEURO:  Cranial nerves II through XII grossly intact, motor grossly intact throughout  PSYCH:  Cognitively intact, oriented to person place and time   ASSESSMENT:    1. Hypertension, unspecified type   2. BMI 40.0-44.9, adult ([place])   3. Therapeutic drug monitoring   4. Essential hypertension   5. Morbid obesity ([place])     PLAN:    # Essential hypertension:  # Morbid obesity: Ms. Ardis Hughs' blood pressure is elevated both initially and on repeat.  This has been a longstanding issue for her both before and throughout pregnancy.  She is working very hard with her diet.  We did discuss the importance of increasing her exercise to 150 minutes weekly.  She has also been struggling to lose weight.  Given her poorly controlled hypertension I do not recommend that she resume phentermine for weight loss.  We will refer her to the healthy weight and wellness clinic as she needs significant dietary guidance.  It also seems as though she has an unhealthy psychology around eating and punishes herself by not eating.  She does not feel that nifedipine has been very effective for her blood pressure.  We will stop the nifedipine and start valsartan/HCTZ 160/12.5 mg.  Check a BMP in 1 week.  She had swelling with amlodipine in the past.  Check a basic metabolic panel in 1 week.  She will track  her blood pressure twice daily and be enrolled in a remote patient monitoring clinic.  She is interested in the prep program at the [facility] but is unsure whether she will have the time to participate.  She is going to think about this.  She was given a hypertension clinic booklet to record her blood pressure measurements.  At times she has had hypokalemia.  We will check plasma renin and aldosterone today to assess for primary hyperaldosteronism.  Additionally we will check renal artery Dopplers.   Disposition:    FU with MD/PharmD in 1 month    Medication Adjustments/Labs and Tests Ordered: Current medicines are reviewed at length with the patient today.  Concerns regarding medicines are outlined above.  Orders Placed This Encounter  Procedures  . Aldosterone + renin activity w/ ratio  . Basic metabolic panel  . Ambulatory referral to [facility]  . VAS US RENAL ARTERY DUPLEX   Meds ordered this encounter  Medications  . valsartan-hydrochlorothiazide (DIOVAN HCT) 160-12.5 MG tablet    Sig: Take 1 tablet by mouth daily.    Dispense:  90 tablet    Refill:  1     Signed, Skeet Latch, MD  06/16/2019 5:58 PM    [place]

## 2019-06-20 ENCOUNTER — Telehealth: Payer: Self-pay

## 2019-06-20 NOTE — Telephone Encounter (Signed)
Call placed to patient who called right back. She works during the day so will need an evening class. Advised will call back when I have evening classes available.

## 2019-06-24 LAB — BASIC METABOLIC PANEL
BUN/Creatinine Ratio: 9 (ref 9–23)
BUN: 7 mg/dL (ref 6–20)
CO2: 23 mmol/L (ref 20–29)
Calcium: 9.2 mg/dL (ref 8.7–10.2)
Chloride: 102 mmol/L (ref 96–106)
Creatinine, Ser: 0.8 mg/dL (ref 0.57–1.00)
GFR calc Af Amer: 110 mL/min/{1.73_m2} (ref >59)
GFR calc non Af Amer: 96 mL/min/{1.73_m2} (ref >59)
Glucose: 84 mg/dL (ref 65–99)
Potassium: 3.5 mmol/L (ref 3.5–5.2)
Sodium: 140 mmol/L (ref 134–144)

## 2019-06-26 ENCOUNTER — Telehealth: Payer: Self-pay | Admitting: Cardiovascular Disease

## 2019-06-26 DIAGNOSIS — Z5181 Encounter for therapeutic drug level monitoring: Secondary | ICD-10-CM

## 2019-06-26 NOTE — Telephone Encounter (Signed)
Please have Ms. Madeline Cooke increase her Diovan to 320mg /25mg  as her BP is still running high.  She will need a repeat BMP at follow up.   TCR

## 2019-06-27 NOTE — Telephone Encounter (Signed)
Left message to call back  

## 2019-06-28 MED ORDER — VALSARTAN-HYDROCHLOROTHIAZIDE 320-25 MG PO TABS: 1.0000 | ORAL_TABLET | Freq: Every day | ORAL | 1 refills | Status: DC

## 2019-06-28 NOTE — Addendum Note (Signed)
Addended by: Regis Bill B on: 06/28/2019 06:55 PM   Modules accepted: Orders

## 2019-06-28 NOTE — Telephone Encounter (Signed)
Sacha followed up on Aldosterone level and lab just behind. Per Dr Duke Salvia recheck BMET 1 week since increasing Diovan HCT to 320-25 mg daily Unble to reach patient, no vm set up

## 2019-06-28 NOTE — Addendum Note (Signed)
Addended by: Regis Bill B on: 06/28/2019 10:25 AM   Modules accepted: Orders

## 2019-06-29 ENCOUNTER — Telehealth: Payer: Self-pay | Admitting: Cardiovascular Disease

## 2019-06-29 ENCOUNTER — Ambulatory Visit (HOSPITAL_COMMUNITY)
Admission: RE | Admit: 2019-06-29 | Discharge: 2019-06-29 | Disposition: A | Discharge status: Home / Self Care | Payer: 59 | Source: Ambulatory Visit | Attending: Cardiovascular Disease | Admitting: Cardiovascular Disease

## 2019-06-29 ENCOUNTER — Other Ambulatory Visit: Payer: Self-pay

## 2019-06-29 DIAGNOSIS — I10 Essential (primary) hypertension: Secondary | ICD-10-CM | POA: Diagnosis not present

## 2019-06-29 NOTE — Telephone Encounter (Signed)
Sent patient a message in Vivify (remote patient monitoring app) to increase Diovan.  Have not received a response yet.  Loraine Bhullar C. Duke Salvia, MD, [facility]  06/29/2019 9:54 AM

## 2019-06-30 ENCOUNTER — Telehealth: Payer: Self-pay | Admitting: *Deleted

## 2019-06-30 DIAGNOSIS — I10 Essential (primary) hypertension: Secondary | ICD-10-CM

## 2019-06-30 DIAGNOSIS — Z5181 Encounter for therapeutic drug level monitoring: Secondary | ICD-10-CM

## 2019-06-30 NOTE — Telephone Encounter (Signed)
Recommendation is the same.  If she continues to feel poorly she can go to the ED or urgent care.  Has she taken the higher dose yet?

## 2019-06-30 NOTE — Telephone Encounter (Signed)
Advised patient, verbalized understanding  

## 2019-06-30 NOTE — Telephone Encounter (Signed)
Spoke with patient and she will get BMET 1 week after increasing the Diovan HCT to 320-25 mg daily  This am she felt little dizzy/lightheaded She did have headache Blood pressure 160/117 Her numbers are still running in the 160's/100's  Will forward to Dr Duke Salvia for review

## 2019-06-30 NOTE — Telephone Encounter (Signed)
Spoke with patient and gave recommendation. She only had one episode this am and is feeling better now Patient did start taking sea moss gel, per Raquel Pharm D hold off on that until now. Patient aware Patient does have h/o of headaches which she uses Fioricet, took this am and h/a subsided.  Patient has been taking the Diovan HCT twice daily, advised to take the lower dose 2 at one time once daily.

## 2019-07-03 ENCOUNTER — Telehealth: Payer: Self-pay | Admitting: *Deleted

## 2019-07-03 DIAGNOSIS — I10 Essential (primary) hypertension: Secondary | ICD-10-CM

## 2019-07-03 DIAGNOSIS — Z5181 Encounter for therapeutic drug level monitoring: Secondary | ICD-10-CM

## 2019-07-03 DIAGNOSIS — E269 Hyperaldosteronism, unspecified: Secondary | ICD-10-CM

## 2019-07-03 NOTE — Telephone Encounter (Signed)
Per Vivify blood pressure readings below   BP 155/122 HR  67 07/03/2019  BP 168/130 HR 67  07/02/2019  BP 150/110 HR 75 07/01/2019  Will forward to Dr Duke Salvia for review

## 2019-07-04 LAB — ALDOSTERONE + RENIN ACTIVITY W/ RATIO
ALDOS/RENIN RATIO: 103.6 — ABNORMAL HIGH (ref 0.0–30.0)
ALDOSTERONE: 20.3 ng/dL (ref 0.0–30.0)
Renin: 0.196 ng/mL/hr (ref 0.167–5.380)

## 2019-07-05 NOTE — Telephone Encounter (Signed)
Left message to call back  

## 2019-07-05 NOTE — Telephone Encounter (Signed)
Attempted to call patient.  VM full.  Labs show hyperaldosteronism.  We need to add spironolactone 25mg .  Check BMP in a week.  She needs an adrenal CT scan too.

## 2019-07-12 MED ORDER — SPIRONOLACTONE 25 MG PO TABS: 25.0000 mg | ORAL_TABLET | Freq: Every day | ORAL | 3 refills | Status: DC

## 2019-07-12 NOTE — Telephone Encounter (Signed)
Follow up   Pt is returning call from [facility]   Please call

## 2019-07-12 NOTE — Telephone Encounter (Signed)
Advised patient, verbalized understanding  CT scheduled for 5/6 at 12:40 at [facility] location, will need contrast prior  Patient aware of date, time, and location

## 2019-07-12 NOTE — Telephone Encounter (Signed)
Left message to call back  

## 2019-07-18 ENCOUNTER — Ambulatory Visit: Payer: 59

## 2019-07-18 NOTE — Progress Notes (Deleted)
     07/18/2019 Madeline Cooke 08-24-1985 [id number]   HPI:  Madeline Cooke is a 34 y.o. female patient of Dr Duke Salvia, with a PMH below who presents today for advanced hypertension clinic monitoring.  Patient was seen by Dr. Duke Salvia on March 26 and registered in our Vivify patient home monitoring program.  She notes that she first was diagnosed with hypertension during her first pregnancy, about 9 years ago and it has never been well controlled.  She had her second child in October and notes that in addition to ongoing hypertension, she has had some issues with weight loss as well.  Took phentermine and was able to lose 40 pounds and was interested in continuing with the medication.  Dr. Duke Salvia discouraged further use and instead gave her information about the PREP exercise program and referred her to [facility].  She switched the nifedipine to valsartan hctz 160/12.5 and ordered plasma renin/aldosterone levels and renal artery dopplers  Renin aldosterone labs showed patient to have hyperaldosteronism and she was started on spironolactone 25 mg daily.  Most recent potassium level was 3.5.  Will have her repeat that today.  Renal artery dopplers were normal.      Blood Pressure Goal:  130/80  Current Medications:  Family Hx:  Social Hx:  Diet:  Exercise: PREP  Home BP readings:  Intolerances:   Labs:  Wt Readings from Last 3 Encounters:  06/16/19 253 lb (114.8 kg)  12/29/18 246 lb 1.6 oz (111.6 kg)  12/09/18 253 lb 3.2 oz (114.9 kg)   BP Readings from Last 3 Encounters:  06/16/19 (!) 162/90  12/29/18 129/81  12/09/18 132/89   Pulse Readings from Last 3 Encounters:  06/16/19 84  12/29/18 72  12/09/18 84    Current Outpatient Medications  Medication Sig Dispense Refill  . butalbital-acetaminophen-caffeine (FIORICET) 50-325-40 MG tablet Take 1 tablet by mouth daily as needed for migraine.    . iron polysaccharides (NIFEREX) 150 MG  capsule Take 1 capsule (150 mg total) by mouth daily. 30 capsule 3  . spironolactone (ALDACTONE) 25 MG tablet Take 1 tablet (25 mg total) by mouth daily. 90 tablet 3  . valsartan-hydrochlorothiazide (DIOVAN HCT) 320-25 MG tablet Take 1 tablet by mouth daily. 90 tablet 1   No current facility-administered medications for this visit.    No Known Allergies  Past Medical History:  Diagnosis Date  . Anemia   . Benign essential HTN, chronic, antepartum, third trimester 12/27/2018  . Diabetes mellitus   . Essential hypertension 06/16/2019  . Gestational diabetes   . Hypertension   . Morbid obesity (HCC) 06/16/2019  . Skin lesion 12/27/2018   Left buttock    unknown if currently breastfeeding.  No problem-specific Assessment & Plan notes found for this encounter.   Phillips Hay PharmD CPP [facility] [address] [place], [place] [address] [phone]

## 2019-07-27 ENCOUNTER — Other Ambulatory Visit: Payer: 59

## 2019-08-11 ENCOUNTER — Telehealth: Payer: Self-pay | Admitting: Cardiovascular Disease

## 2019-08-11 ENCOUNTER — Other Ambulatory Visit: Payer: 59

## 2019-08-11 NOTE — Telephone Encounter (Signed)
Madeline Cooke is calling requesting Madeline Cooke give her a call in regards to an appointment she had scheduled for her at another office due to needing to reschedule and wanting to let her know and discuss it. Please advise.

## 2019-08-11 NOTE — Telephone Encounter (Signed)
Follow up     Pt is calling back and says the call was dropped    Please call back

## 2019-08-11 NOTE — Telephone Encounter (Signed)
Pt called to let Juliette Alcide know that she rescheduled her CT for 08/14/19.

## 2019-08-14 ENCOUNTER — Inpatient Hospital Stay: Admission: RE | Admit: 2019-08-14 | Payer: 59 | Source: Ambulatory Visit

## 2019-08-23 ENCOUNTER — Telehealth: Payer: Self-pay | Admitting: *Deleted

## 2019-08-23 NOTE — Telephone Encounter (Signed)
Left message for patient to call regarding CT abdomen ordered by Dr. Duke Salvia

## 2019-08-24 ENCOUNTER — Telehealth: Payer: Self-pay | Admitting: Cardiovascular Disease

## 2019-08-24 NOTE — Telephone Encounter (Signed)
BP running high in Vivify RPM.  Called patient to discuss.  She is taking all medications prescribed.  She thinks it is diet related from the holidays.  She will call to reschedule PHarmD and CT appointments.

## 2019-08-31 NOTE — Telephone Encounter (Signed)
See phone note Dr Duke Salvia 6/3

## 2019-09-11 NOTE — Telephone Encounter (Signed)
Left message for patient to call and discuss rescheduling CT Adrenal Abd ordered by Dr. Duke Salvia

## 2019-09-12 ENCOUNTER — Other Ambulatory Visit: Payer: 59

## 2019-09-14 ENCOUNTER — Encounter: Payer: Self-pay | Admitting: *Deleted

## 2019-09-14 NOTE — Telephone Encounter (Signed)
Patient has not responded to our calls to reschedule CT Adrenal ABD ordered by Dr. Girtha Rm mail letter requesting patient call our office.

## 2019-09-19 ENCOUNTER — Telehealth: Payer: Self-pay

## 2019-09-19 NOTE — Telephone Encounter (Signed)
Left message requesting call back to discuss evening PREP class starting 10/10/19.  

## 2019-10-02 ENCOUNTER — Other Ambulatory Visit: Payer: 59

## 2019-10-11 ENCOUNTER — Telehealth: Payer: Self-pay | Admitting: General Practice

## 2019-10-11 NOTE — Telephone Encounter (Signed)
Please contact Ms. Christella Hartigan and ask her if she needs any assistance with verify.  Her last entry was 09/27/2019.  Thank you.  Helpdesk number [phone]

## 2019-10-11 NOTE — Telephone Encounter (Signed)
Left detailed message for pt to call us back if she needs our help. If she is having trouble with her maching to call their help desk at [phone].

## 2019-10-13 NOTE — Telephone Encounter (Signed)
LEFT DETAILED MESSAGE THAT WE HAVE NOT RECEIVED ANY BP'S FROM HER AND TO CB TO DISCUSS

## 2019-10-20 ENCOUNTER — Telehealth: Payer: Self-pay

## 2019-10-20 NOTE — Telephone Encounter (Signed)
Called patient to discuss inactivity of BP checks with Vivify. Left message for patient to return call.

## 2019-10-23 ENCOUNTER — Telehealth: Payer: Self-pay

## 2019-10-23 DIAGNOSIS — Z0000 Encounter for general adult medical examination without abnormal findings: Secondary | ICD-10-CM

## 2019-10-23 NOTE — Telephone Encounter (Signed)
Called patient to discuss BP checks with Vivify. Patient answered and hung up.

## 2019-10-25 ENCOUNTER — Ambulatory Visit
Admission: RE | Admit: 2019-10-25 | Discharge: 2019-10-25 | Disposition: A | Discharge status: Home / Self Care | Payer: 59 | Source: Ambulatory Visit | Attending: Cardiovascular Disease | Admitting: Cardiovascular Disease

## 2019-10-25 DIAGNOSIS — I10 Essential (primary) hypertension: Secondary | ICD-10-CM

## 2019-10-25 DIAGNOSIS — E269 Hyperaldosteronism, unspecified: Secondary | ICD-10-CM

## 2019-11-01 ENCOUNTER — Telehealth: Payer: Self-pay | Admitting: Cardiovascular Disease

## 2019-11-01 DIAGNOSIS — Z5181 Encounter for therapeutic drug level monitoring: Secondary | ICD-10-CM

## 2019-11-01 DIAGNOSIS — I10 Essential (primary) hypertension: Secondary | ICD-10-CM

## 2019-11-01 NOTE — Telephone Encounter (Signed)
Follow Up:   Pt is calling back, concerning her CT results.

## 2019-11-01 NOTE — Telephone Encounter (Signed)
Patient called with CT results Routed to PCP, as she needs to follow up regarding breast nodules

## 2019-11-06 NOTE — Telephone Encounter (Signed)
Pt called to report that she received her Ct report but now she wants to know what the next step is for her BP... she has not been sending readings and she says she knows she needs to send more but  Her BP is still running 150-170/ 80's... she says she does not want to keep adding meds and wants to know what the next step is and what she will ned to do to get her BP down.. she does not have a list of BP readings to give me... I have asked her to start keeping a record of her readings this will help dr. Duke Salvia to better treat her.   Will froward to Dr. Duke Salvia for review.

## 2019-11-06 NOTE — Telephone Encounter (Signed)
Patient is calling to follow up in regards to results. She states she would like to discuss what she needs to do next. Please call.

## 2019-11-06 NOTE — Telephone Encounter (Signed)
LMTCB

## 2019-11-08 ENCOUNTER — Telehealth: Payer: Self-pay | Admitting: Cardiovascular Disease

## 2019-11-08 MED ORDER — SPIRONOLACTONE 50 MG PO TABS: 50.0000 mg | ORAL_TABLET | Freq: Every day | ORAL | 3 refills | Status: DC

## 2019-11-08 NOTE — Addendum Note (Signed)
Addended by: Regis Bill B on: 11/08/2019 01:55 PM   Modules accepted: Orders

## 2019-11-08 NOTE — Telephone Encounter (Signed)
Tried to call patient, mailbox full.

## 2019-11-08 NOTE — Telephone Encounter (Signed)
There is no magic bullet.  Diet, exercise and medication.  Increase spironolactone to 50mg .  BMP in a week.  Track in Vivify please.

## 2019-11-08 NOTE — Telephone Encounter (Signed)
Confirmed taking Spironolactone 25 mg daily, advised of increase and labs. Verbalized understanding

## 2019-11-08 NOTE — Telephone Encounter (Signed)
Patient is returning Madeline Cooke's call. Transferred call to [place].

## 2019-11-15 ENCOUNTER — Telehealth: Payer: Self-pay

## 2019-11-15 NOTE — Telephone Encounter (Signed)
-----   Message from Ronney Asters, NP sent at 11/14/2019  5:59 PM EDT ----- Regarding: FW: Vivify BP Checks [name]  Please contact Ms. Madeline Cooke and schedule her for an OV. Thank you. Verdon Cummins  ----- Message ----- From: Chilton Si, MD Sent: 11/14/2019   5:51 PM EDT To: Burnell Blanks, LPN, Ronney Asters, NP, # Subject: RE: Vivify BP Checks                           Ms. Madeline Cooke has not been seen in clinic since March.  She cannot be managed on Vivify alone.  SHe needs to come to clinic for an appointment or at least schedule and complete a video visit.  If she cannot do this we  will need to stop monitoring in Vivify.  TCR ----- Message ----- From: Renaee Munda Sent: 11/14/2019   9:59 AM EDT To: Chilton Si, MD, Ronney Asters, NP, # Subject: Vivify BP Checks                               Hi Rosalva Ferron sent a message that she has been checking her BP for the last couple of days and her readings have said "HI" without dia/sys numbers. Can you reach out to this patient? I am assuming that her BP is now uncontrolled and needs to come in.  Thanks, Amy

## 2019-11-15 NOTE — Telephone Encounter (Signed)
LM2CB-needs appt-bring BP monitor to appt

## 2019-11-16 NOTE — Telephone Encounter (Signed)
Her follow up can be with PharmD, Verdon Cummins or me if that helps her get in sooner.  It can also be a video visit.

## 2019-11-16 NOTE — Telephone Encounter (Signed)
Called pt and scheduled appointment for f/u with Dr Duke Salvia 12/21/2019 4:00 PM. This was the 1st available appointment that pt could cone to. Is this OK or do you want to schedule a different appt? Please advise.

## 2019-11-17 NOTE — Telephone Encounter (Signed)
Patient needs ADV HTN CLINIC visit time not regular office visit slot Left message to call back

## 2019-11-23 NOTE — Telephone Encounter (Signed)
MESSAGE SENT TO SCHEDULING TO CB PT AND RESCHEDULE APPT

## 2019-12-04 NOTE — Telephone Encounter (Signed)
Left message for patient to call and reschedule 12/21/19 appointment with Dr. Virginia Rochester to be scheduled with the PharmD per Fabiola Backer, LPN

## 2019-12-07 ENCOUNTER — Telehealth: Payer: Self-pay

## 2019-12-07 NOTE — Telephone Encounter (Signed)
-----   Message from Ronney Asters, NP sent at 12/07/2019  9:56 AM EDT ----- Regarding: RE: High BP  Burley Saver please,  Please add amlodipine 2.5 to her medication regimen and have her continue to track her blood pressures.  She has a follow-up appointment on 12/20/2019 with Dr. Duke Salvia.  Thank you. ----- Message ----- From: Renaee Munda Sent: 12/07/2019   9:36 AM EDT To: Ronney Asters, NP Subject: High BP                                        Hi Verdon Cummins,  I wanted to inform you that Kimberleigh has started to check her BP again and it has been on the high side. The last reading was 150/110. The previous reading was 145/111. I know these few readings are not much to go on, but I assume that with them being close readings that her BP is consistently high and that her meds may need revisiting. Just a thought.  Let me know how I can assist in this matter.  Thanks, Amy

## 2019-12-07 NOTE — Telephone Encounter (Signed)
LM2CB-BP AND MEDICATION CHANGE

## 2019-12-11 NOTE — Telephone Encounter (Signed)
LM2CB 

## 2019-12-12 NOTE — Addendum Note (Signed)
Addended by: Berenice Bouton on: 12/12/2019 02:47 PM   Modules accepted: Orders

## 2019-12-13 MED ORDER — HYDRALAZINE HCL 10 MG PO TABS: 10.0000 mg | ORAL_TABLET | Freq: Three times a day (TID) | ORAL | 6 refills | Status: DC

## 2019-12-13 NOTE — Telephone Encounter (Signed)
PER JC,FNP-C: Ronney Asters, NP @ 10:11 AM Please have her start hydralazine 10 mg 3 times daily. She has an appointment scheduled for Dr. Duke Salvia on 12/20/2019. She can further evaluate at that time and make recommendations. Thank you.   Pt informed of providers result & recommendations. Pt verbalized understanding. No further questions .

## 2019-12-13 NOTE — Telephone Encounter (Signed)
Called pt and informed her of JC, FNP-C's recommendations. Pt states that she has tried Amlodipine before and it does not work also she states that she has tried "another BP medication for when you are pregnant" and this is too strong" also. Will discuss this with JC and call pt back. Verbalized understanding

## 2019-12-14 NOTE — Telephone Encounter (Signed)
Patient has appointment 9/29

## 2019-12-15 ENCOUNTER — Telehealth: Payer: Self-pay | Admitting: Cardiovascular Disease

## 2019-12-15 NOTE — Telephone Encounter (Signed)
Follow up:     Please call patient back.

## 2019-12-15 NOTE — Telephone Encounter (Signed)
Patients blood pressure 170/130 in [employer] and spoke with patient and she had stopped the Aldactone and Diovan when Hydralazine added Advised patient the Hydralazine was in addition to the others, patient verbalized understanding Advised to call back if no improvement

## 2019-12-15 NOTE — Telephone Encounter (Signed)
Follow up:     Patient calling stating that she is at work and she is having a hard time getting threw. Please call patient.

## 2019-12-15 NOTE — Telephone Encounter (Signed)
Agree this is in addition to not instead of.  When your blood pressure is this high we cannot stop 1 medicine in order to add others.

## 2019-12-15 NOTE — Telephone Encounter (Signed)
New message    Pt c/o medication issue:  1. Name of Medication: hydrALAZINE (APRESOLINE) 10 MG tablet [[id number]]   2. How are you currently taking this medication (dosage and times per day)? Take 1 tablet (10 mg total) by mouth 3 (three) times daily  3. Are you having a reaction (difficulty breathing--STAT)? No   4. What is your medication issue? Pt called in and stated that she was given this new med yesterday but her bp is still running high  bp- 170/ or something /  She could not remember  She stated it is making her sleepy and she feels like it is not helping.   Please advise  Best number -[phone]

## 2019-12-15 NOTE — Telephone Encounter (Signed)
Patient was observed yelling at our office staff.  I asked to speak to the patient and she voiced her frustration about receiving phone calls during her work day.  She then started yelling about the fact that she is 34 years old and not wanting to take an additional blood pressure medication which would mean that she would be taking three medications. I explained that despite the current extensive work up including renal artery Dopplers, renin/aldosterone, adrenal CT, etc, unfortunately no cause has been identified.  She was seen in the office 05/2019 and was instructed to follow up in one month.  She has yet to be seen.  She then began to yell at me.  I instructed her that she has yelled at several staff members and is now yelling at me, and that is not acceptable.  She reported that she is loud because she is on Bluetooth.  She was on Bluetooth for the entire conversation and only became loud when she was upset.  She was again informed that it will not be acceptable to yell at me or our staff.  Although I wish there were a single tablet she could take to control her blood pressure, this does not seem to be the case and she will not be allowed to take her frustration about her medical condition out on our staff.  She reported that she may need to find another healthcare provider and I let her know that that would be fine if that is what will help her to get her blood pressure controlled.  If she would like to schedule in office discussion and management in the future we will be happy to see her.  However, we will no longer be able to manage her care over the phone.  Madeline Cooke C. Duke Salvia, MD, [facility] 12/15/2019 5:19 PM

## 2019-12-15 NOTE — Telephone Encounter (Signed)
Left message for patient to call back  

## 2019-12-15 NOTE — Telephone Encounter (Signed)
Follow Up:     Pt is returning Pam's call from a few minutes ago.

## 2019-12-15 NOTE — Telephone Encounter (Signed)
Patient was started on hydralazine 10mg  TID yesterday by NP Patient has OV with Dr. Verdon Cummins clinic on 9/29 Her BP last night was high last night and this morning Asked patient for the readings and she said she sent them in the portal Upon questioning, realized she is using the Vivify system, which I do not have access to Explained I will notify 10/29 and patient states that is who she wanted a call back from [facility] triage process and apologized for inconvenience  Message routed to Dr. [facility] LPN

## 2019-12-20 ENCOUNTER — Ambulatory Visit: Payer: 59

## 2019-12-20 NOTE — Progress Notes (Deleted)
     12/20/2019 Bertine Schlottman February 01, 1986 [id number]   HPI:  Madeline Cooke is a 34 y.o. female patient of Dr Duke Salvia, with a PMH below who presents today for hypertension clinic evaluation.  She was seen by Dr. Duke Salvia back in March and started with the Vivify monitoring.  At that time her pressure was 162/90 and she felt that her nifedipine was effective and it was replaced with valsartan hctz 160/12.5.  Because of history of low potassium, she was tested for hyperaldosteronism at that time.  Lab came back showing elevated renin/aldosterone ratio.  She was started on spironolactone after reaching out to her by phone.  Sea moss gel?  Was taking valsar/hctz bid, advised to do 2 tabs 1x daily   Spironolactone started 4/12 25 mg 6/3 BP high in vivify - pt thought due to holiday and diet,  8/2 hung up on Amy when called to discuss BP checks 8/11 - c/o BP still elevated, no readings in Vivify, increased spirono to 50 9/16 - start hydralazine 10 mg tid 9/24 - pt had stopped valsartan hctz and spironolactone when started hydralazine BP 170/130    Past Medical History:                   Blood Pressure Goal:  130/80  Current Medications:  Family Hx:  Social Hx:  Diet:  Exercise:  Home BP readings:  Intolerances:   Labs:  Wt Readings from Last 3 Encounters:  06/16/19 253 lb (114.8 kg)  12/29/18 246 lb 1.6 oz (111.6 kg)  12/09/18 253 lb 3.2 oz (114.9 kg)   BP Readings from Last 3 Encounters:  06/16/19 (!) 162/90  12/29/18 129/81  12/09/18 132/89   Pulse Readings from Last 3 Encounters:  06/16/19 84  12/29/18 72  12/09/18 84    Current Outpatient Medications  Medication Sig Dispense Refill  . butalbital-acetaminophen-caffeine (FIORICET) 50-325-40 MG tablet Take 1 tablet by mouth daily as needed for migraine.    . hydrALAZINE (APRESOLINE) 10 MG tablet Take 1 tablet (10 mg total) by mouth 3 (three) times daily. 90 tablet 6  . iron polysaccharides  (NIFEREX) 150 MG capsule Take 1 capsule (150 mg total) by mouth daily. 30 capsule 3  . spironolactone (ALDACTONE) 50 MG tablet Take 1 tablet (50 mg total) by mouth daily. 90 tablet 3  . valsartan-hydrochlorothiazide (DIOVAN HCT) 320-25 MG tablet Take 1 tablet by mouth daily. 90 tablet 1   No current facility-administered medications for this visit.    No Known Allergies  Past Medical History:  Diagnosis Date  . Anemia   . Benign essential HTN, chronic, antepartum, third trimester 12/27/2018  . Diabetes mellitus   . Essential hypertension 06/16/2019  . Gestational diabetes   . Hypertension   . Morbid obesity (HCC) 06/16/2019  . Skin lesion 12/27/2018   Left buttock    unknown if currently breastfeeding.  No problem-specific Assessment & Plan notes found for this encounter.   Phillips Hay PharmD CPP [facility] [address] [place], [place] [address] [phone]

## 2019-12-21 ENCOUNTER — Ambulatory Visit: Payer: 59 | Admitting: Cardiovascular Disease

## 2020-02-13 ENCOUNTER — Telehealth: Payer: Self-pay

## 2020-02-13 DIAGNOSIS — Z0000 Encounter for general adult medical examination without abnormal findings: Secondary | ICD-10-CM

## 2020-02-13 NOTE — Telephone Encounter (Signed)
Called patient to inform them that they have successfully completed the Vivify 16-week program and to keep a paper log of their BP to bring to follow up appointments. Left patient a message to return call to Care Guide at [phone].  

## 2020-02-29 ENCOUNTER — Other Ambulatory Visit: Payer: Self-pay

## 2020-02-29 ENCOUNTER — Ambulatory Visit
Admission: RE | Admit: 2020-02-29 | Discharge: 2020-02-29 | Disposition: A | Discharge status: Home / Self Care | Payer: 59 | Source: Ambulatory Visit | Attending: Obstetrics and Gynecology | Admitting: Obstetrics and Gynecology

## 2020-02-29 ENCOUNTER — Other Ambulatory Visit: Payer: Self-pay | Admitting: Obstetrics and Gynecology

## 2020-02-29 DIAGNOSIS — N6320 Unspecified lump in the left breast, unspecified quadrant: Secondary | ICD-10-CM

## 2020-09-02 ENCOUNTER — Other Ambulatory Visit: Payer: 59

## 2020-09-10 ENCOUNTER — Other Ambulatory Visit: Payer: 59

## 2020-10-09 IMAGING — US US MFM OB COMP + 14 WK
1 series · 13 of 28 positions shown · non-contrast
Comparison: none

[Series 1: us mfm ob comp + 14 wk · 48 acquisitions, 13 frames shown]
[im 2/48]
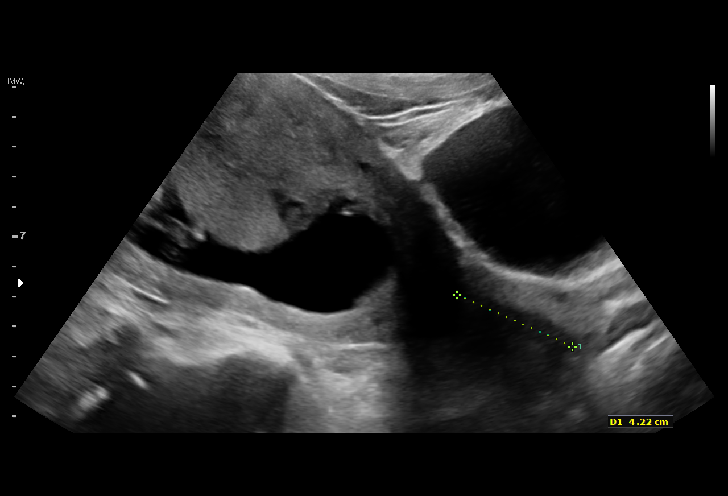
[im 6/48]
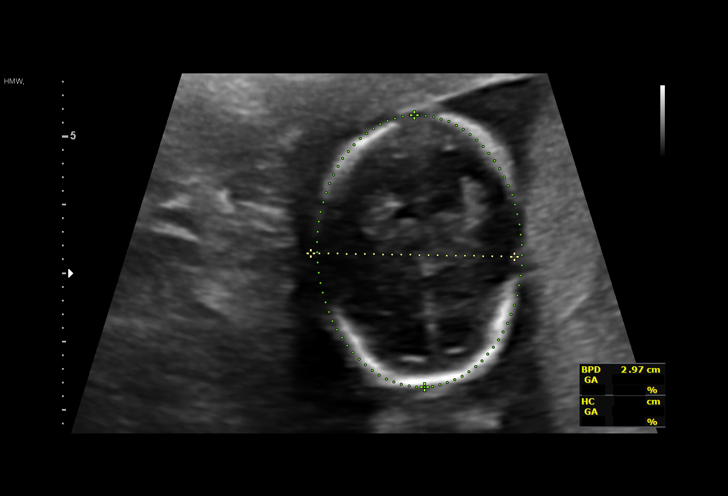
[im 9/48]
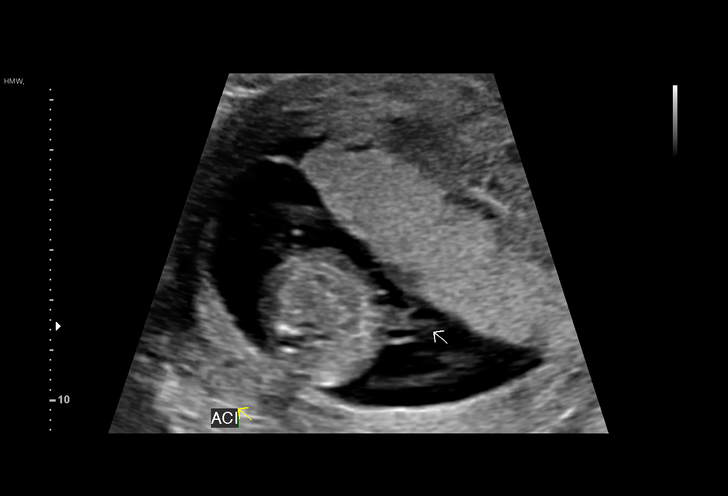
[im 13/48]
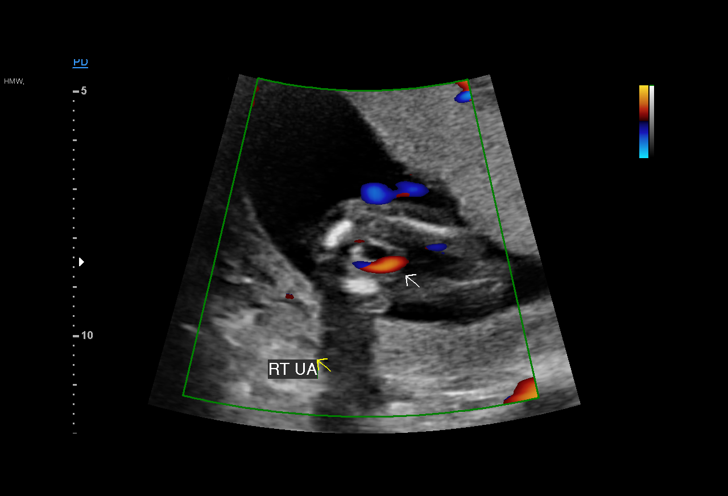
[im 16/48]
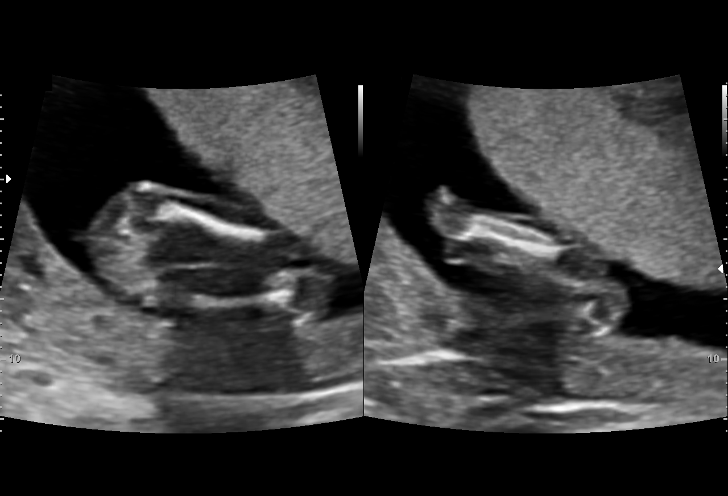
[im 20/48]
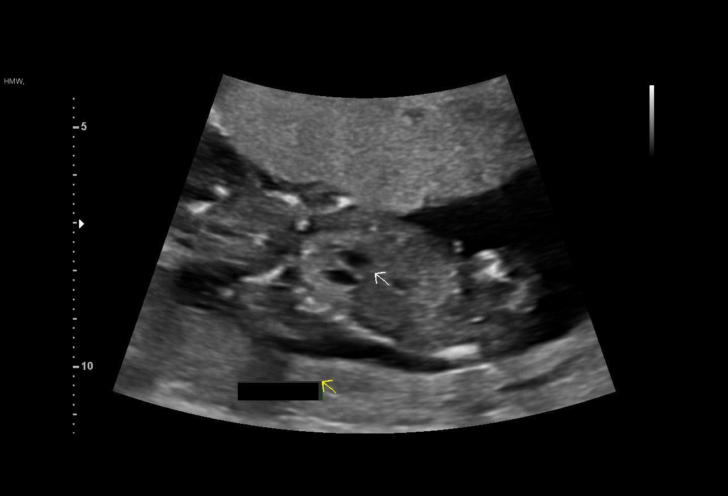
[im 25/48]
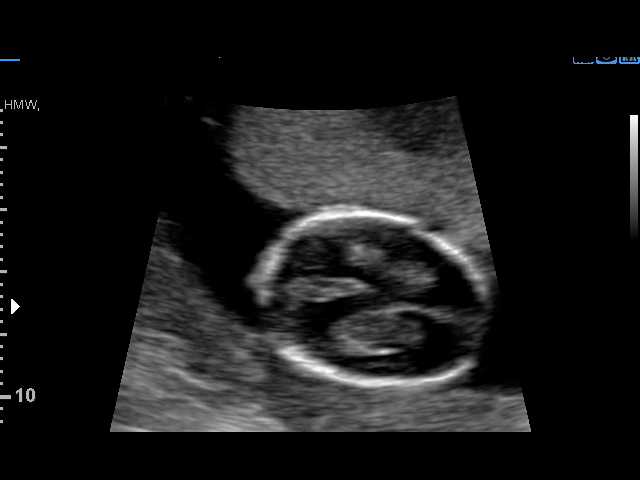
[im 28/48]
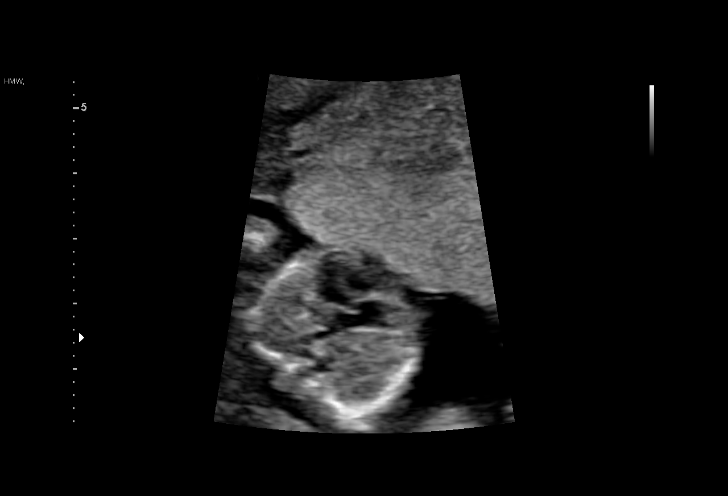
[im 32/48]
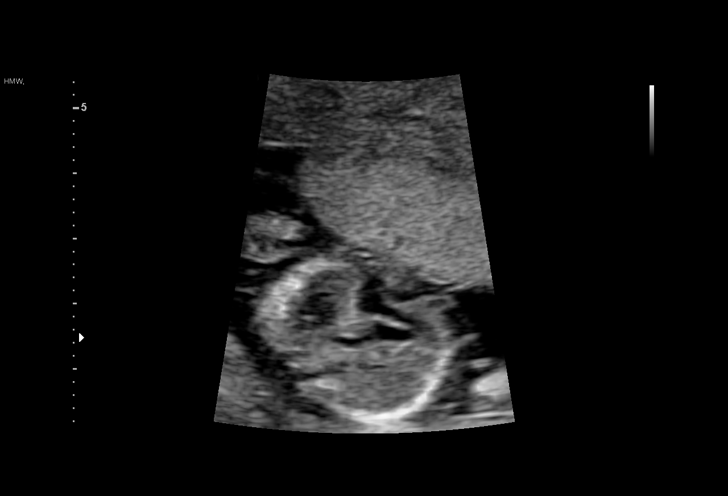
[im 35/48]
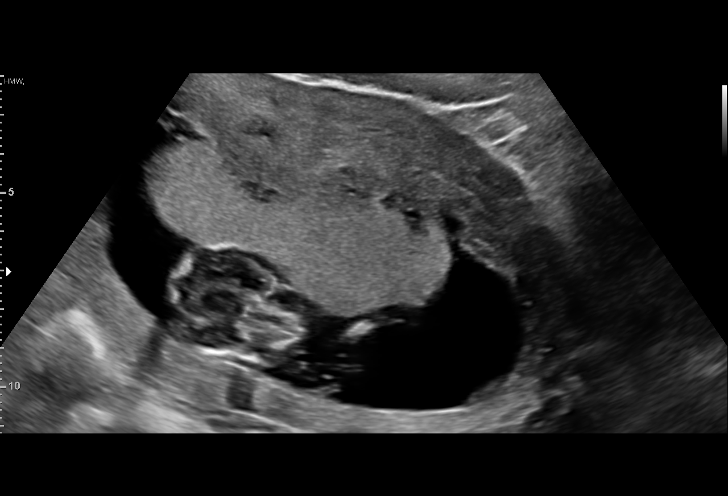
[im 39/48]
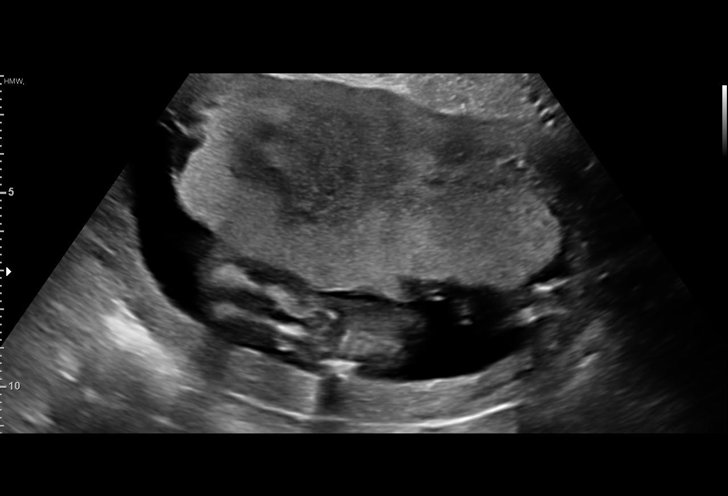
[im 42/48]
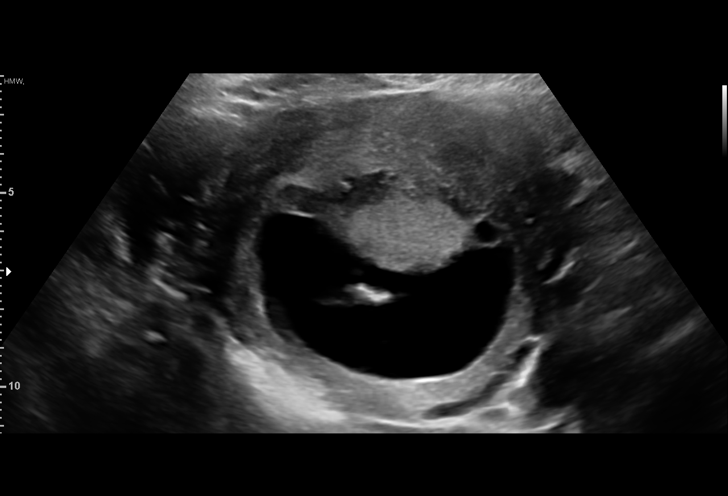
[im 46/48]
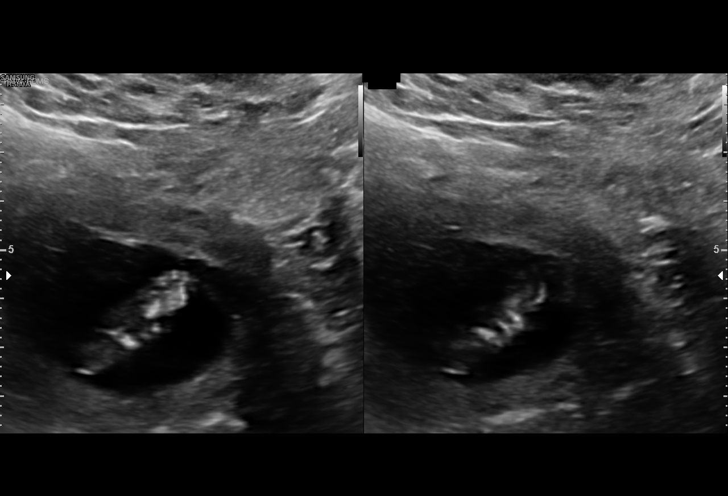

[13 of 28 positions shown; findings below may reference images not displayed]

[REDACTED]

                                                       STRAUSS
 ----------------------------------------------------------------------

 ----------------------------------------------------------------------
Indications

  Advanced maternal age multigravida 35+,
  second trimester
  Hypertension - Chronic/Pre-existing
  (Methyldopa)
  History of sickle cell trait
  Abnormal biochemical screen (NIPS- low
  fetal fraction)
  Encounter for antenatal screening for
  malformations
  Obesity complicating pregnancy, second
  trimester(BMI 39.85)
  15 weeks gestation of pregnancy
 ----------------------------------------------------------------------
Vital Signs

 BMI:
Fetal Evaluation

 Num Of Fetuses:          1
 Fetal Heart Rate(bpm):   149
 Cardiac Activity:        Observed
 Presentation:            Breech
 Placenta:                Anterior
 P. Cord Insertion:       Visualized, central

 Amniotic Fluid
 AFI FV:      Within normal limits
Biometry
 BPD:      29.7  mm     G. Age:  15w 3d         50  %    CI:        73.23   %    70 - 86
                                                         FL/HC:       16.3  %    15.3 -
 HC:      110.3  mm     G. Age:  15w 2d         34  %    HC/AC:       1.25       1.05 -
 AC:       88.5  mm     G. Age:  15w 0d         47  %    FL/BPD:      60.6  %
 FL:         18  mm     G. Age:  15w 2d         47  %    FL/AC:       20.3  %    20 - 24
 HUM:      18.8  mm     G. Age:  15w 4d         61  %

 Est. FW:     118   gm     0 lb 4 oz     73  %
OB History

 Gravidity:    6         Term:   4        Prem:   0        SAB:   0
 TOP:          1       Ectopic:  0        Living: 4
Gestational Age

 LMP:           15w 2d        Date:  04/11/18                 EDD:   01/16/19
 U/S Today:     15w 2d                                        EDD:   01/16/19
 Best:          15w 2d     Det. By:  LMP  (04/11/18)          EDD:   01/16/19
Anatomy

 Cranium:               Appears normal         Aortic Arch:            Not well visualized
 Cavum:                 Not well visualized    Ductal Arch:            Not well visualized
 Ventricles:            Not well visualized    Diaphragm:              Appears normal
 Choroid Plexus:        Appears normal         Stomach:                Appears normal, left
                                                                       sided
 Cerebellum:            Not well visualized    Abdomen:                Appears normal
 Posterior Fossa:       Not well visualized    Abdominal Wall:         Appears nml (cord
                                                                       insert, abd wall)
 Nuchal Fold:           Not well visualized    Cord Vessels:           Abnormal, see
                                                                       comments
 Face:                  Not well visualized    Kidneys:                Appear normal
 Lips:                  Not well visualized    Bladder:                Appears normal
 Thoracic:              Appears normal         Spine:                  Not well visualized
 Heart:                 Not well visualized    Upper Extremities:      Visualized
 RVOT:                  Not well visualized    Lower Extremities:      Visualized
 LVOT:                  Appears normal

 Other:  Technically difficult due to early gestational age. Technically difficult
         due to fetal position. Technically difficult due to maternal habitus.
Cervix Uterus Adnexa

 Cervix
 Normal appearance by transabdominal scan.

 Uterus
 No abnormality visualized.

 Left Ovary
 No adnexal mass visualized.

 Right Ovary
 No adnexal mass visualized.

 Cul De Sac
 No free fluid seen.
 Adnexa
 No abnormality visualized.
Impression

 Normal interval growth.  No ultrasonic evidence of structural
 fetal anomalies.
 Suboptimal views were obtained secondary to fetal
 gestational age and position.
 Known low fetal fraction. Genetic counseling today.
 Suspected single umbilical artery
Recommendations

 Follow up anatomy is scheduled in 4 weeks.

## 2021-02-05 ENCOUNTER — Ambulatory Visit
Admission: RE | Admit: 2021-02-05 | Discharge: 2021-02-05 | Disposition: A | Discharge status: Home / Self Care | Payer: 59 | Source: Ambulatory Visit | Attending: Obstetrics and Gynecology | Admitting: Obstetrics and Gynecology

## 2021-02-05 ENCOUNTER — Other Ambulatory Visit: Payer: Self-pay

## 2021-02-05 DIAGNOSIS — N6320 Unspecified lump in the left breast, unspecified quadrant: Secondary | ICD-10-CM

## 2021-02-14 IMAGING — US US MFM OB FOLLOW-UP
1 series · 12 of 28 positions shown · non-contrast
Comparison: none

[Series 1: us mfm ob follow-up · 12 of 30 slices shown]
[im 2/30]
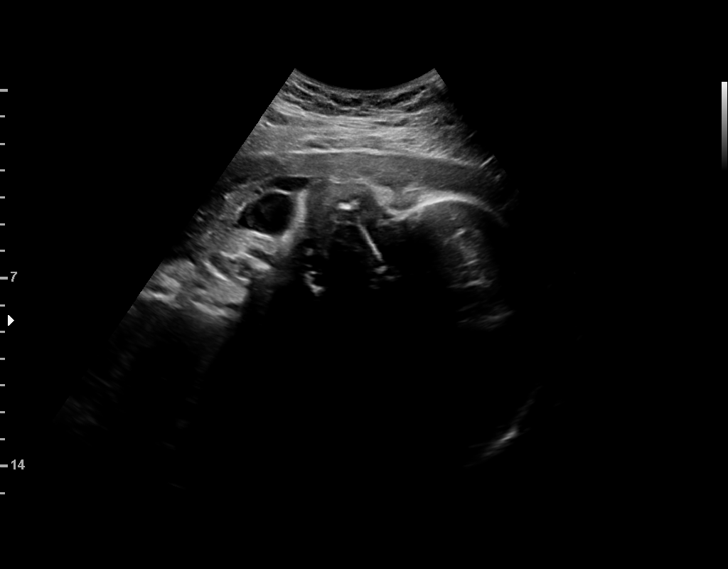
[im 4/30]
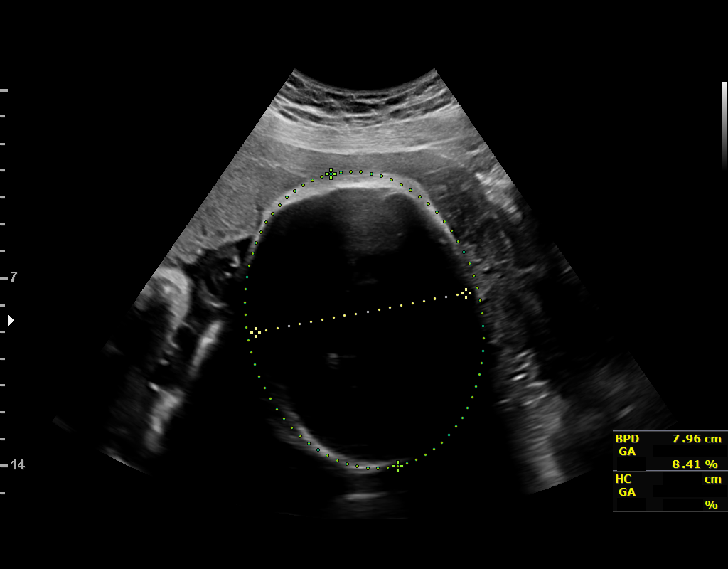
[im 6/30]
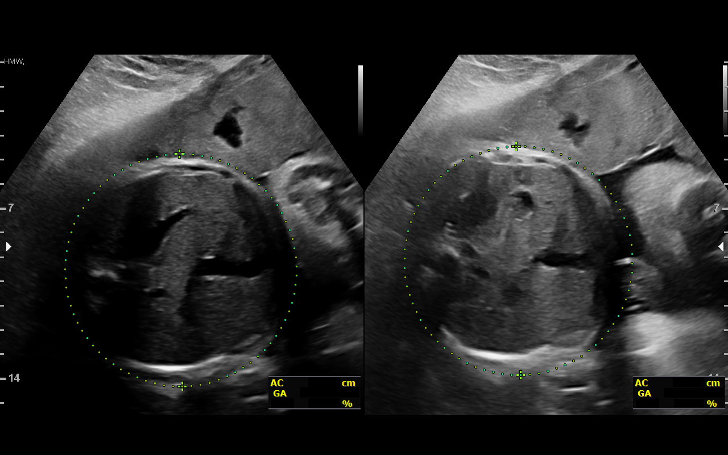
[im 9/30]
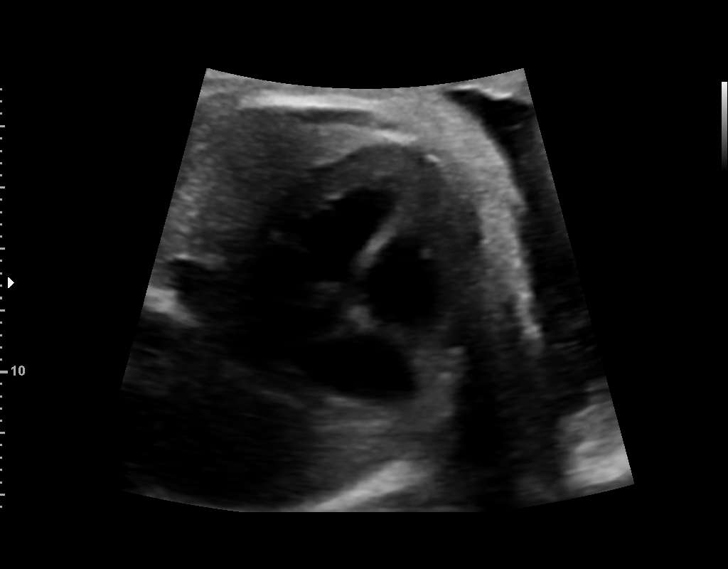
[im 11/30]
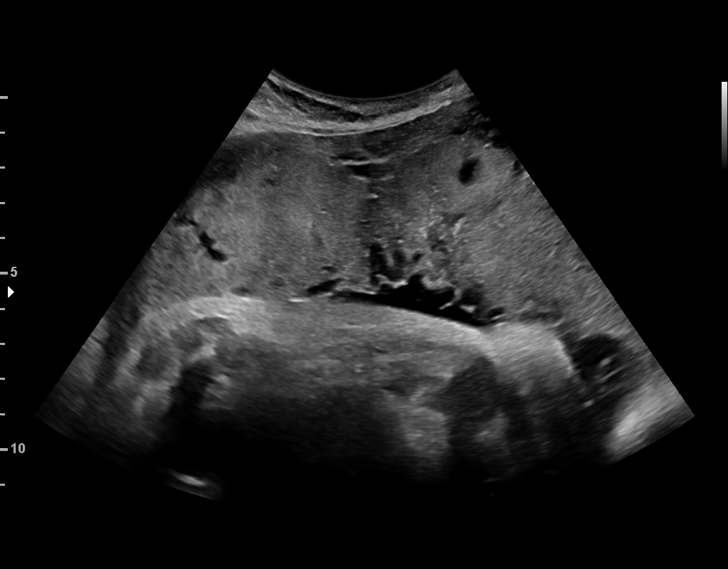
[im 13/30]
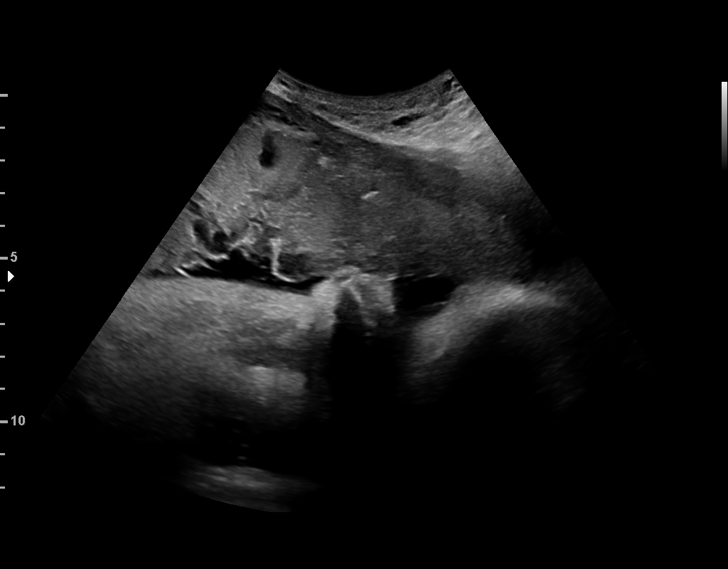
[im 17/30]
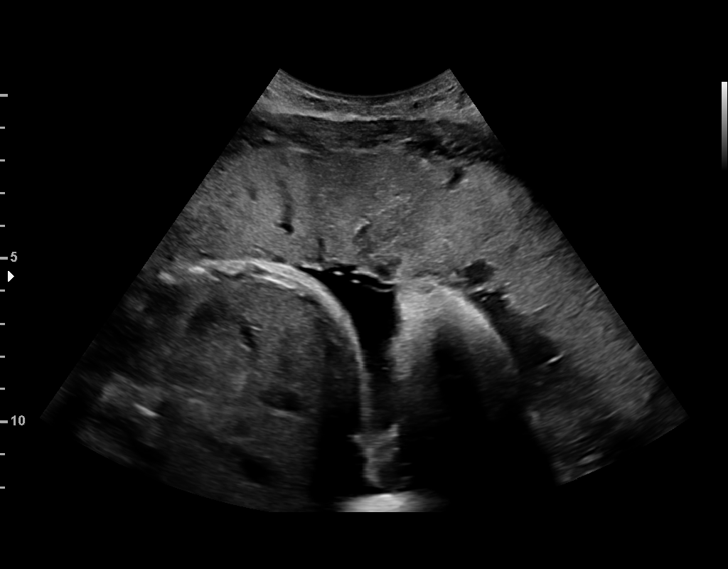
[im 19/30]
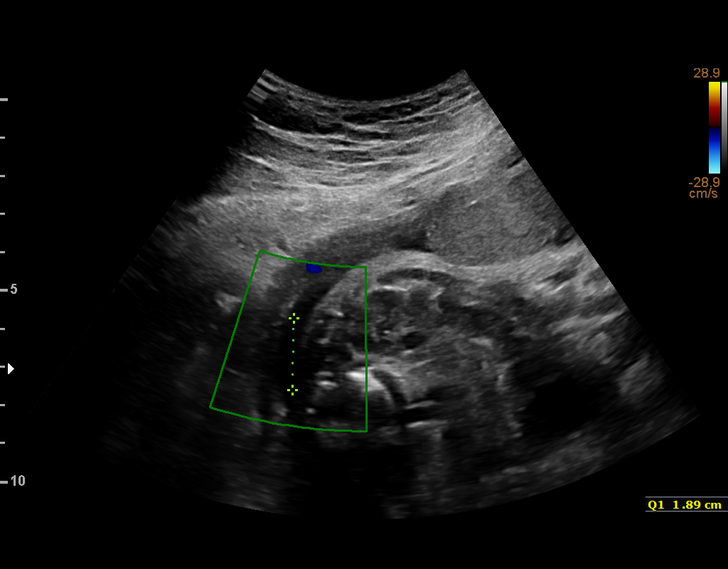
[im 21/30]
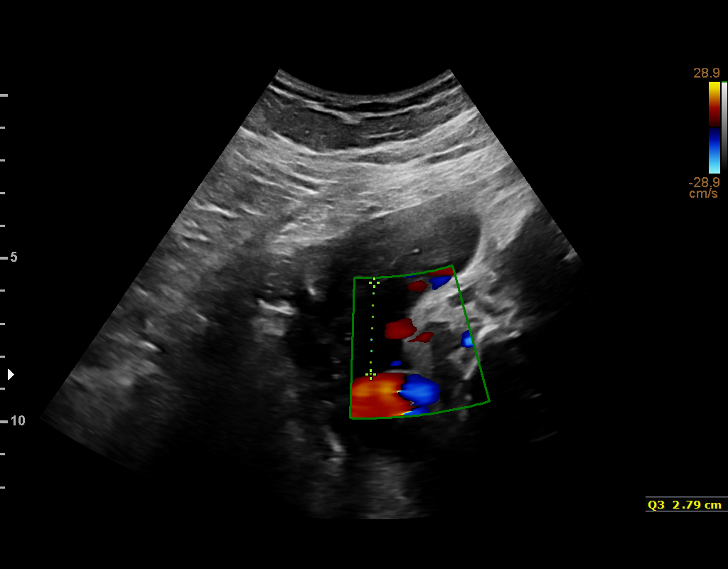
[im 24/30]
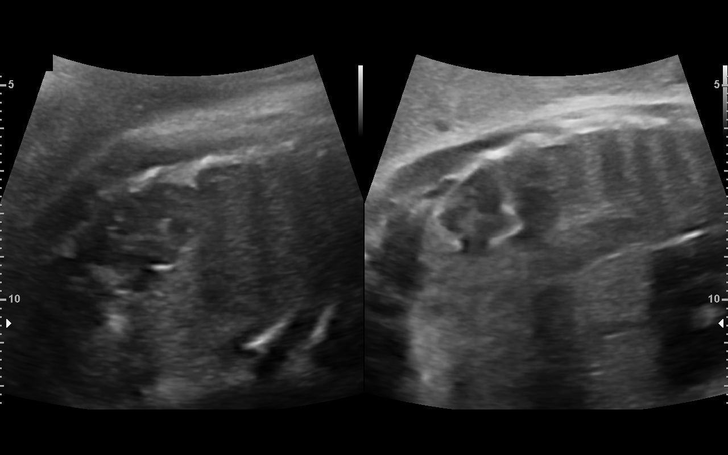
[im 26/30]
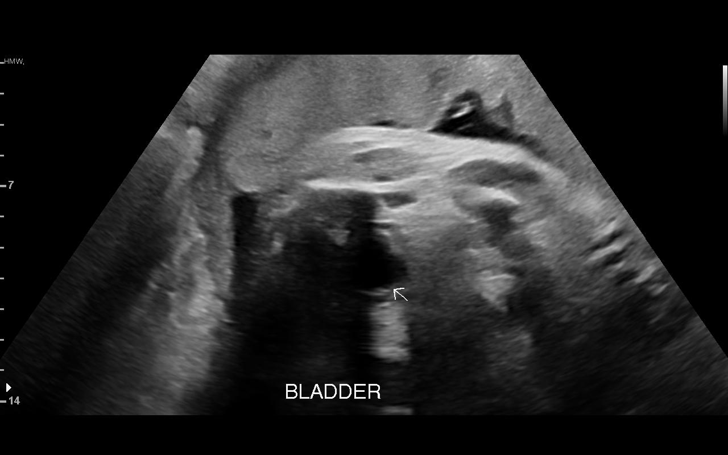
[im 28/30]
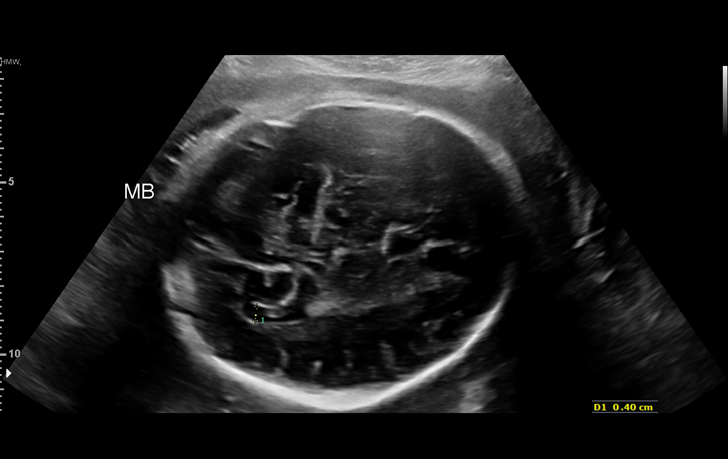

[12 of 28 positions shown; findings below may reference images not displayed]

[REDACTED]

 ----------------------------------------------------------------------

 ----------------------------------------------------------------------
Indications

  33 weeks gestation of pregnancy
  Advanced maternal age multigravida 35+,
  second trimester
  Hypertension - Chronic/Pre-existing
  (Methyldopa)
  History of sickle cell trait
  Abnormal biochemical screen (NIPS- low
  fetal fraction)
  Encounter for antenatal screening for
  malformations
  Obesity complicating pregnancy, second
  trimester(BMI 39.85)
 ----------------------------------------------------------------------
Vital Signs

                                                Height:        5'3"
Fetal Evaluation

 Num Of Fetuses:         1
 Fetal Heart Rate(bpm):  135
 Cardiac Activity:       Observed
 Presentation:           Cephalic
 Placenta:               Anterior
 P. Cord Insertion:      Previously Visualized

 Amniotic Fluid
 AFI FV:      Within normal limits
 AFI Sum(cm)     %Tile       Largest Pocket(cm)
 10.07           19

 RUQ(cm)       RLQ(cm)       LUQ(cm)        LLQ(cm)

Biophysical Evaluation

 Amniotic F.V:   Within normal limits       F. Tone:        Observed
 F. Movement:    Observed                   Score:          [DATE]
 F. Breathing:   Observed
Biometry

 BPD:        79  mm     G. Age:  31w 5d          6  %    CI:         66.3   %    70 - 86
                                                         FL/HC:      20.9   %    19.4 -
 HC:      311.1  mm     G. Age:  34w 6d         44  %    HC/AC:      1.05        0.96 -
 AC:      297.5  mm     G. Age:  33w 5d         57  %    FL/BPD:     82.3   %    71 - 87
 FL:         65  mm     G. Age:  33w 4d         38  %    FL/AC:      21.8   %    20 - 24
 HUM:      61.6  mm     G. Age:  35w 5d       > 95  %

 Est. FW:    0002  gm    4 lb 15 oz      42  %
OB History

 Gravidity:    6         Term:   4        Prem:   0        SAB:   0
 TOP:          1       Ectopic:  0        Living: 4
Gestational Age

 LMP:           33w 4d        Date:  04/11/18                 EDD:   01/16/19
 U/S Today:     33w 3d                                        EDD:   01/17/19
 Best:          33w 4d     Det. By:  LMP  (04/11/18)          EDD:   01/16/19
Anatomy

 Cranium:               Appears normal         LVOT:                   Previously seen
 Cavum:                 Previously seen        Aortic Arch:            Previously seen
 Ventricles:            Appears normal         Ductal Arch:            Not well visualized
 Choroid Plexus:        Previously seen        Diaphragm:              Previously seen
 Cerebellum:            Previously seen        Stomach:                Appears normal, left
                                                                       sided
 Posterior Fossa:       Previously seen        Abdomen:                Previously seen
 Nuchal Fold:           Not applicable (>20    Abdominal Wall:         Previously seen
                        wks GA)
 Face:                  Orbits and profile     Cord Vessels:           Previously seen
                        previously seen
 Lips:                  Previously seen        Kidneys:                Appear normal
 Palate:                Not well visualized    Bladder:                Appears normal
 Thoracic:              Appears normal         Spine:                  Previously seen
 Heart:                 Previously seen        Upper Extremities:      Previously seen
 RVOT:                  Previously seen        Lower Extremities:      Previously seen

 Other:  Heels previously seen. Bilateral 5Liz digit not well visualized. Male
         gender previously seen.
Cervix Uterus Adnexa

 Cervix
 Not visualized (advanced GA >04wks)
 Uterus
 No abnormality visualized.

 Left Ovary
 No adnexal mass visualized.

 Right Ovary
 No adnexal mass visualized.

 Cul De Sac
 No free fluid seen.

 Adnexa
 No abnormality visualized.
Impression

 Patient with chronic hypertension returns for fetal growth
 assessment and antenatal testing.  She takes nifedipine for
 blood pressure control.  Office her BP were 160/101 and a
 158/101 mmHg.  Patient does not have symptoms of severe
 headache or visual disturbances or right upper quadrant pain.
 She reports good fetal movements.  She mentioned that she
 had forgotten to take nifedipine today.

 On ultrasound fetal growth is appropriate for gestational age.
 Amniotic fluid is normal and good fetal activity seen.
 Antenatal testing is reassuring.  BPP [DATE].

 I encouraged her and emphasized the importance of good
 blood pressure control to prevent maternal complications
 including stroke.  I discussed her blood pressure parameters.
 Patient reports she can get her blood pressure checked
 tomorrow.
Recommendations

 -Continue weekly BPP till delivery.
                 Jormasie, Zeeah

## 2021-10-13 ENCOUNTER — Telehealth: Payer: Self-pay

## 2021-10-13 DIAGNOSIS — Z0000 Encounter for general adult medical examination without abnormal findings: Secondary | ICD-10-CM

## 2021-10-13 NOTE — Telephone Encounter (Signed)
Called patient to determine if she returned Vivify cuff upon completion of the program or if she still has access to the device and need to return it to Dr. Duke Salvia. Was unable to leave a message due to voicemail being full.    Fredrika Canby Nedra Hai, [facility] Guide, Health Coach [address] [place] [address] Telephone: [phone] Email: [email or web address]

## 2021-12-26 ENCOUNTER — Other Ambulatory Visit (HOSPITAL_COMMUNITY): Payer: Self-pay | Admitting: General Surgery

## 2021-12-26 ENCOUNTER — Other Ambulatory Visit (HOSPITAL_COMMUNITY): Payer: Self-pay | Admitting: Otolaryngology

## 2022-01-02 ENCOUNTER — Ambulatory Visit (HOSPITAL_COMMUNITY)
Admission: RE | Admit: 2022-01-02 | Discharge: 2022-01-02 | Disposition: A | Discharge status: Home / Self Care | Payer: 59 | Source: Ambulatory Visit | Attending: General Surgery | Admitting: General Surgery

## 2022-01-02 ENCOUNTER — Other Ambulatory Visit (HOSPITAL_COMMUNITY): Payer: Self-pay | Admitting: General Surgery

## 2022-01-02 ENCOUNTER — Encounter (HOSPITAL_COMMUNITY): Payer: Self-pay | Admitting: Radiology

## 2022-01-08 ENCOUNTER — Encounter: Payer: Self-pay | Admitting: Dietician

## 2022-01-08 ENCOUNTER — Encounter: Payer: 59 | Attending: General Surgery | Admitting: Dietician

## 2022-01-08 DIAGNOSIS — Z713 Dietary counseling and surveillance: Secondary | ICD-10-CM | POA: Insufficient documentation

## 2022-01-08 DIAGNOSIS — I10 Essential (primary) hypertension: Secondary | ICD-10-CM | POA: Diagnosis not present

## 2022-01-08 DIAGNOSIS — Z6841 Body Mass Index (BMI) 40.0 and over, adult: Secondary | ICD-10-CM | POA: Diagnosis not present

## 2022-01-08 DIAGNOSIS — D649 Anemia, unspecified: Secondary | ICD-10-CM | POA: Diagnosis not present

## 2022-01-08 DIAGNOSIS — E669 Obesity, unspecified: Secondary | ICD-10-CM

## 2022-01-08 NOTE — Progress Notes (Signed)
Nutrition Assessment for Bariatric Surgery Medical Nutrition Therapy Appt Start Time: 11:00    End Time: 12:02  Patient was seen on 01/08/2022 for Pre-Operative Nutrition Assessment. Letter of approval faxed to [facility] bariatric surgery program coordinator on 01/08/2022.   Referral stated Supervised Weight Loss (SWL) visits needed: 0  Pt completed visits.   Pt has cleared nutrition requirements.   Planned surgery: Sleeve Gastrectomy   NUTRITION ASSESSMENT   Anthropometrics  Start weight at [facility]: 275.8 lbs (date: 01/08/2022)  Height: 63 in BMI: 48.86 kg/m2     Clinical  Medical hx: HTN, anemia obesity Medications: Amlodipine, losartan HTZ   Labs: Vit D; 16.1; glucose 113; potasium 3.3 Notable signs/symptoms: none noted Any previous deficiencies? No  Micronutrient Nutrition Focused Physical Exam: Hair: No issues observed Eyes: No issues observed Mouth: No issues observed Neck: No issues observed Nails: No issues observed Skin: No issues observed  Lifestyle & Dietary Hx  Pt arrived with her 38 year old son, "Marvis Moeller" Patient lives with husband and 4 children. The pt and her husband performs the food shopping and the pt and husband prepares the meals. She reports that she typically skips or misses 7 out of 21 possible meals per week. She may have 2-3 meals per week that are take-out or at a restaurant.  Patient works for [employer] from home. She denies binge eating though has felt shame and/or guilt after eating too much food.  She denies having used laxatives or vomiting to facilitate weight loss. She admits to emotional eating during times of stress. She states that she knows the difference between hunger and thirst and can tell when she is full. Pt states she took Depo shot (birth control), stating she thinks that contributed to her weight gain.  Pt states she used a type of laxative as a cleans for women. Pt states she gets migraines if she does not  eat. Pt states she may use pasta as a comfort food, stating she used to eat ice cream when stressed, but not so much any more. Pt states she takes Vit D supplement daily.  Physical Activity: Walking every day, 30 minutes, workout twice a week at the [facility]  Sleep Hygiene: duration and quality: getting better, 6-7 hours a night  Current Patient Perceived Stress Level as stated by pt on a scale of 1-10:  4      Stress Management Techniques: walking/ nature walk, get some air  Fruit servings per week: 7-10 Non starchy vegetable servings per week: 4-5 Whole Grains per week: 0  24-Hr Dietary Recall First Meal: protein shake (Atkins) Snack: greek yogurt, fruit Second Meal: salad and more yogurt Snack: quest chips or yogurt or juice beets, carrots, apples Third Meal: meat, vegetable/salad Snack:  Beverages: water (64 oz), sweet tea on occasion, [place] juice  Alcoholic beverages per week: 0   Estimated Energy Needs Calories: 1500   NUTRITION DIAGNOSIS  Overweight/obesity ([place]-3.3) related to past poor dietary habits and physical inactivity as evidenced by patient w/ planned sleeve surgery following dietary guidelines for continued weight loss.    NUTRITION INTERVENTION  Nutrition counseling (C-1) and education (E-2) to facilitate bariatric surgery goals.  Educated pt on micronutrient deficiencies post surgery and strategies to mitigate that risk   Pre-Op Goals Reviewed with the Patient Track food and beverage intake (pen and paper, MyFitness Pal, Baritastic app, etc.) Make healthy food choices while monitoring portion sizes Consume 3 meals per day or try to eat every 3-5 hours Avoid  concentrated sugars and fried foods Keep sugar & fat in the single digits per serving on food labels Practice CHEWING your food (aim for applesauce consistency) Practice not drinking 15 minutes before, during, and 30 minutes after each meal and snack Avoid all carbonated beverages (ex: soda, sparkling  beverages)  Limit caffeinated beverages (ex: coffee, tea, energy drinks) Avoid all sugar-sweetened beverages (ex: regular soda, sports drinks)  Avoid alcohol  Aim for 64-100 ounces of FLUID daily (with at least half of fluid intake being plain water)  Aim for at least 60-80 grams of PROTEIN daily Look for a liquid protein source that contains ?15 g protein and ?5 g carbohydrate (ex: shakes, drinks, shots) Make a list of non-food related activities Physical activity is an important part of a healthy lifestyle so keep it moving! The goal is to reach 150 minutes of exercise per week, including cardiovascular and weight baring activity.  *Goals that are bolded indicate the pt would like to start working towards these  Handouts Provided Include  Bariatric Surgery handouts (Nutrition Visits, Pre-Op Goals, Protein Shakes, Vitamins & Minerals)  Learning Style & Readiness for Change Teaching method utilized: Visual & Auditory  Demonstrated degree of understanding via: Teach Back  Readiness Level: Preparation Barriers to learning/adherence to lifestyle change: nothing identified  RD's Notes for Next Visit     MONITORING & EVALUATION Dietary intake, weekly physical activity, body weight, and pre-op goals reached at next nutrition visit.    Next Steps  Pt has completed visits. No further supervised visits required/recomended  Patient is to follow up at [place] for Pre-Op Class >2 weeks before surgery for further nutrition education.

## 2022-01-26 ENCOUNTER — Encounter: Payer: Self-pay | Admitting: Skilled Nursing Facility1

## 2022-01-26 ENCOUNTER — Encounter: Payer: 59 | Attending: General Surgery | Admitting: Skilled Nursing Facility1

## 2022-01-26 NOTE — Progress Notes (Signed)
Pre-Operative Nutrition Class:    Patient was seen on 01/26/2022 for Pre-Operative Bariatric Surgery Education [facility]    Surgery date:  Surgery type: sleeve Start weight at [facility]: 275.8 Weight today: 273  Samples given per MNT protocol. Patient educated on appropriate usage:  [place] Lot # [id number] Exp: 9/26   Bariatric Advantage Calcium  Lot # [id number] Exp:02/10/2022   Ensure Max Protein Shake Lot # [id number] Exp: [id number]    The following the learning objectives were met by the patient during this course: Identify Pre-Op Dietary Goals and will begin 2 weeks pre-operatively Identify appropriate sources of fluids and proteins  State protein recommendations and appropriate sources pre and post-operatively Identify Post-Operative Dietary Goals and will follow for 2 weeks post-operatively Identify appropriate multivitamin and calcium sources Describe the need for physical activity post-operatively and will follow MD recommendations State when to call healthcare provider regarding medication questions or post-operative complications When having a diagnosis of diabetes understanding hypoglycemia symptoms and the inclusion of 1 complex carbohydrate per meal  Handouts given during class include: Pre-Op Bariatric Surgery Diet Handout Protein Shake Handout Post-Op Bariatric Surgery Nutrition Handout BELT Program Information Flyer Support Group Information Flyer [facility] Bariatric Supplements Price List  Follow-Up Plan: Patient will follow-up at [facility] 2 weeks post operatively for diet advancement per MD.

## 2022-02-04 NOTE — Progress Notes (Signed)
Sent message, via epic in basket, requesting orders in epic from surgeon.  

## 2022-02-05 ENCOUNTER — Ambulatory Visit (HOSPITAL_COMMUNITY): Payer: Self-pay | Admitting: Licensed Clinical Social Worker

## 2022-02-08 ENCOUNTER — Ambulatory Visit: Payer: Self-pay | Admitting: General Surgery

## 2022-02-08 DIAGNOSIS — I10 Essential (primary) hypertension: Secondary | ICD-10-CM

## 2022-02-08 DIAGNOSIS — O9902 Anemia complicating childbirth: Secondary | ICD-10-CM

## 2022-02-09 NOTE — Patient Instructions (Signed)
SURGICAL WAITING ROOM VISITATION Patients having surgery or a procedure may have no more than 2 support people in the waiting area - these visitors may rotate in the visitor waiting room.   Children under the age of 23 must have an adult with them who is not the patient. If the patient needs to stay at the hospital during part of their recovery, the visitor guidelines for inpatient rooms apply.  PRE-OP VISITATION  Pre-op nurse will coordinate an appropriate time for 1 support person to accompany the patient in pre-op.  This support person may not rotate.  This visitor will be contacted when the time is appropriate for the visitor to come back in the pre-op area.  Please refer to the [facility] website for the visitor guidelines for Inpatients (after your surgery is over and you are in a regular room).  You are not required to quarantine at this time prior to your surgery. However, you must do this: Hand Hygiene often Do NOT share personal items Notify your provider if you are in close contact with someone who has COVID or you develop fever 100.4 or greater, new onset of sneezing, cough, sore throat, shortness of breath or body aches.  If you test positive for Covid or have been in contact with anyone that has tested positive in the last 10 days please notify you surgeon.    Your procedure is scheduled on:  Monday  February 23, 2022  Report to [facility] Main Entrance: [place] entrance where the [employer] is available.   Report to admitting at: 12:00  noon  +++++Call this number if you have any questions or problems the morning of surgery [phone]  Do not eat food after Midnight the night prior to your surgery/procedure.  After Midnight you may have the following liquids until   11:15  AM  DAY OF SURGERY  Clear Liquid Diet Water Black Coffee (sugar ok, NO MILK/CREAM OR CREAMERS)  Tea (sugar ok, NO MILK/CREAM OR CREAMERS) regular and decaf                              Plain Jell-O  with no fruit (NO RED)                                           Fruit ices (not with fruit pulp, NO RED)                                     Popsicles (NO RED)                                                                  Juice: apple, WHITE grape, WHITE cranberry Sports drinks like Gatorade or Powerade (NO RED)                    The day of surgery:  Drink ONE (1) Pre-Surgery G2 at   11:15  AM the morning of surgery. Drink in one sitting. Do not sip.  This drink  was given to you during your hospital pre-op appointment visit. Nothing else to drink after completing the Pre-Surgery  G2 : No candy, chewing gum or throat lozenges.    FOLLOW BOWEL PREP AND ANY ADDITIONAL PRE OP INSTRUCTIONS YOU RECEIVED FROM YOUR SURGEON'S OFFICE!!!   Oral Hygiene is also important to reduce your risk of infection.        Remember - BRUSH YOUR TEETH THE MORNING OF SURGERY WITH YOUR REGULAR TOOTHPASTE  Do NOT smoke after Midnight the night before surgery.  Take ONLY these medicines the morning of surgery with A SIP OF WATER: Amlodipine   If You have been diagnosed with Sleep Apnea - Bring CPAP mask and tubing day of surgery. We will provide you with a CPAP machine on the day of your surgery.                   You may not have any metal on your body including hair pins, jewelry, and body piercing  Do not wear make-up, lotions, powders, perfumes or deodorant  Do not wear nail polish including gel and S&S, artificial / acrylic nails, or any other type of covering on natural nails including finger and toenails. If you have artificial nails, gel coating, etc., that needs to be removed by a nail salon, Please have this removed prior to surgery. Not doing so may mean that your surgery could be cancelled or delayed if the Surgeon or anesthesia staff feels like they are unable to monitor you safely.   Do not shave 48 hours prior to surgery to avoid nicks in your skin which may contribute to  postoperative infections.   Contacts, Hearing Aids, dentures or bridgework may not be worn into surgery.   You may bring a small overnight bag with you on the day of surgery, only pack items that are not valuable .[facility] IS NOT RESPONSIBLE   FOR VALUABLES THAT ARE LOST OR STOLEN.    Do not bring your home medications to the hospital. The Pharmacy will dispense medications listed on your medication list to you during your admission in the Hospital.  Special Instructions: Bring a copy of your healthcare power of attorney and living will documents the day of surgery, if you wish to have them scanned into your [facility] Medical Records- EPIC  Please read over the following fact sheets you were given: IF YOU HAVE QUESTIONS ABOUT YOUR PRE-OP INSTRUCTIONS, PLEASE CALL [phone]  (KAY)    - Preparing for Surgery Before surgery, you can play an important role.  Because skin is not sterile, your skin needs to be as free of germs as possible.  You can reduce the number of germs on your skin by washing with CHG (chlorahexidine gluconate) soap before surgery.  CHG is an antiseptic cleaner which kills germs and bonds with the skin to continue killing germs even after washing. Please DO NOT use if you have an allergy to CHG or antibacterial soaps.  If your skin becomes reddened/irritated stop using the CHG and inform your nurse when you arrive at Short Stay. Do not shave (including legs and underarms) for at least 48 hours prior to the first CHG shower.  You may shave your face/neck.  Please follow these instructions carefully:  1.  Shower with CHG Soap the night before surgery and the  morning of surgery.  2.  If you choose to wash your hair, wash your hair first as usual with your normal  shampoo.  3.  After you shampoo, rinse your hair and body thoroughly to remove the shampoo.                             4.  Use CHG as you would any other liquid soap.  You can apply chg directly to  the skin and wash.  Gently with a scrungie or clean washcloth.  5.  Apply the CHG Soap to your body ONLY FROM THE NECK DOWN.   Do not use on face/ open                           Wound or open sores. Avoid contact with eyes, ears mouth and genitals (private parts).                       Wash face,  Genitals (private parts) with your normal soap.             6.  Wash thoroughly, paying special attention to the area where your  surgery  will be performed.  7.  Thoroughly rinse your body with warm water from the neck down.  8.  DO NOT shower/wash with your normal soap after using and rinsing off the CHG Soap.            9.  Pat yourself dry with a clean towel.            10.  Wear clean pajamas.            11.  Place clean sheets on your bed the night of your first shower and do not  sleep with pets.  ON THE DAY OF SURGERY : Do not apply any lotions/deodorants the morning of surgery.  Please wear clean clothes to the hospital/surgery center.    FAILURE TO FOLLOW THESE INSTRUCTIONS MAY RESULT IN THE CANCELLATION OF YOUR SURGERY  PATIENT SIGNATURE_________________________________  NURSE SIGNATURE__________________________________  ________________________________________________________________________        Rogelia Mire    An incentive spirometer is a tool that can help keep your lungs clear and active. This tool measures how well you are filling your lungs with each breath. Taking long deep breaths may help reverse or decrease the chance of developing breathing (pulmonary) problems (especially infection) following: A long period of time when you are unable to move or be active. BEFORE THE PROCEDURE  If the spirometer includes an indicator to show your best effort, your nurse or respiratory therapist will set it to a desired goal. If possible, sit up straight or lean slightly forward. Try not to slouch. Hold the incentive spirometer in an upright position. INSTRUCTIONS FOR  USE  Sit on the edge of your bed if possible, or sit up as far as you can in bed or on a chair. Hold the incentive spirometer in an upright position. Breathe out normally. Place the mouthpiece in your mouth and seal your lips tightly around it. Breathe in slowly and as deeply as possible, raising the piston or the ball toward the top of the column. Hold your breath for 3-5 seconds or for as long as possible. Allow the piston or ball to fall to the bottom of the column. Remove the mouthpiece from your mouth and breathe out normally. Rest for a few seconds and repeat Steps 1 through 7 at least 10 times every 1-2 hours when you are awake. Take your time and  take a few normal breaths between deep breaths. The spirometer may include an indicator to show your best effort. Use the indicator as a goal to work toward during each repetition. After each set of 10 deep breaths, practice coughing to be sure your lungs are clear. If you have an incision (the cut made at the time of surgery), support your incision when coughing by placing a pillow or rolled up towels firmly against it. Once you are able to get out of bed, walk around indoors and cough well. You may stop using the incentive spirometer when instructed by your caregiver.  RISKS AND COMPLICATIONS Take your time so you do not get dizzy or light-headed. If you are in pain, you may need to take or ask for pain medication before doing incentive spirometry. It is harder to take a deep breath if you are having pain. AFTER USE Rest and breathe slowly and easily. It can be helpful to keep track of a log of your progress. Your caregiver can provide you with a simple table to help with this. If you are using the spirometer at home, follow these instructions: [place] IF:  You are having difficultly using the spirometer. You have trouble using the spirometer as often as instructed. Your pain medication is not giving enough relief while using the  spirometer. You develop fever of 100.5 F (38.1 C) or higher.                                                                                                    SEEK IMMEDIATE MEDICAL CARE IF:  You cough up bloody sputum that had not been present before. You develop fever of 102 F (38.9 C) or greater. You develop worsening pain at or near the incision site. MAKE SURE YOU:  Understand these instructions. Will watch your condition. Will get help right away if you are not doing well or get worse. Document Released: 07/20/2006 Document Revised: 06/01/2011 Document Reviewed: 09/20/2006 [facility] Patient Information 2014 [place], [place].

## 2022-02-09 NOTE — Progress Notes (Addendum)
COVID Vaccine received:  []  No [x]  Yes Date of any COVID positive Test in last 90 days: None  PCP - Raquel James, MD  at [facility] Preop clearance- NOT CLEARED 02-04-22  Note in CE d/t uncontrolled HTN, sees PCP back in 1 week on (02-13-22)for recheck of HTN. [address]  [address]  [place], [place] [address]  [phone] (Work)  [phone] (Fax)   Cardiologist - Chilton Si, MD  Chest x-ray - 01-02-2022  2v EKG -  do at PST Stress Test -  ECHO -  Cardiac Cath -   PCR screen: []  Ordered & Completed                      []   No Order but Needs PROFEND                      [x]   N/A for this surgery  Surgery Plan:  []  Ambulatory                            [x]  Outpatient in bed                            []  Admit  Anesthesia:    [x]  General  []  Spinal                           []   Choice []   MAC  Bowel Prep - [x]  No  []   Yes _____________  Pacemaker / ICD device [x]  No []  Yes        Device order form faxed [x]  No    []   Yes      Faxed to:  Spinal Cord Stimulator:[x]  No []  Yes      (Remind patient to bring remote DOS) Other Implants:   History of Sleep Apnea? [x]  No []  Yes   CPAP used?- [x]  No []  Yes    Does the patient monitor blood sugar? []  No [x]  Yes  []  N/A Does patient have a [employer] or Dexacom? [x]  No []  Yes   Fasting Blood Sugar Ranges-  Checks Blood Sugar ___0__ times a day Has not checked her CBG since she had gestational DM.   Blood Thinner / Instructions: none Aspirin Instructions:  ERAS Protocol Ordered: []  No  [x]  Yes PRE-SURGERY []  ENSURE  [x]  G2  []  No Drink Ordered  Patient is to be NPO after: 11:15 am  Activity level: Patient can climb a flight of stairs without difficulty; [x]  No CP   but would have _SOB__   Patient can perform ADLs without assistance.   Anesthesia review: HTN, anemia, Pre-DM (Diet only)  Patient denies shortness of breath, fever, cough and chest pain at PAT appointment.  Patient  verbalized understanding and agreement to the Pre-Surgical Instructions that were given to them at this PAT appointment. Patient was also educated of the need to review these PAT instructions again prior to his/her surgery.I reviewed the appropriate phone numbers to call if they have any and questions or concerns.

## 2022-02-11 ENCOUNTER — Other Ambulatory Visit: Payer: Self-pay

## 2022-02-11 ENCOUNTER — Encounter (HOSPITAL_COMMUNITY): Payer: Self-pay

## 2022-02-11 ENCOUNTER — Encounter (HOSPITAL_COMMUNITY)
Admission: RE | Admit: 2022-02-11 | Discharge: 2022-02-11 | Disposition: A | Discharge status: Home / Self Care | Payer: 59 | Source: Ambulatory Visit | Attending: General Surgery | Admitting: General Surgery

## 2022-02-11 VITALS — BP 134/95 | HR 78 | Temp 99.2°F | Resp 22 | Ht 63.0 in | Wt 272.0 lb

## 2022-02-11 DIAGNOSIS — O9902 Anemia complicating childbirth: Secondary | ICD-10-CM | POA: Insufficient documentation

## 2022-02-11 DIAGNOSIS — I10 Essential (primary) hypertension: Secondary | ICD-10-CM | POA: Diagnosis not present

## 2022-02-11 DIAGNOSIS — Z01818 Encounter for other preprocedural examination: Secondary | ICD-10-CM | POA: Insufficient documentation

## 2022-02-11 DIAGNOSIS — R7303 Prediabetes: Secondary | ICD-10-CM | POA: Insufficient documentation

## 2022-02-11 HISTORY — DX: Sickle-cell trait: D57.3

## 2022-02-11 HISTORY — DX: Prediabetes: R73.03

## 2022-02-11 HISTORY — DX: Personal history of urinary calculi: Z87.442

## 2022-02-11 HISTORY — DX: Unspecified osteoarthritis, unspecified site: M19.90

## 2022-02-11 LAB — COMPREHENSIVE METABOLIC PANEL
ALT: 25 U/L (ref 0–44)
AST: 23 U/L (ref 15–41)
Albumin: 3.9 g/dL (ref 3.5–5.0)
Alkaline Phosphatase: 63 U/L (ref 38–126)
Anion gap: 6 (ref 5–15)
BUN: 15 mg/dL (ref 6–20)
CO2: 28 mmol/L (ref 22–32)
Calcium: 9.2 mg/dL (ref 8.9–10.3)
Chloride: 105 mmol/L (ref 98–111)
Creatinine, Ser: 0.9 mg/dL (ref 0.44–1.00)
GFR, Estimated: >60 mL/min (ref >60)
Glucose, Bld: 103 mg/dL — ABNORMAL HIGH (ref 70–99)
Potassium: 3.2 mmol/L — ABNORMAL LOW (ref 3.5–5.1)
Sodium: 139 mmol/L (ref 135–145)
Total Bilirubin: 0.4 mg/dL (ref 0.3–1.2)
Total Protein: 7.6 g/dL (ref 6.5–8.1)

## 2022-02-11 LAB — CBC WITH DIFFERENTIAL/PLATELET
Abs Immature Granulocytes: 0.02 10*3/uL (ref 0.00–0.07)
Basophils Absolute: 0 10*3/uL (ref 0.0–0.1)
Basophils Relative: 0 %
Eosinophils Absolute: 0.1 10*3/uL (ref 0.0–0.5)
Eosinophils Relative: 1 %
HCT: 38.5 % (ref 36.0–46.0)
Hemoglobin: 12.7 g/dL (ref 12.0–15.0)
Immature Granulocytes: 0 %
Lymphocytes Relative: 23 %
Lymphs Abs: 1.4 10*3/uL (ref 0.7–4.0)
MCH: 27.1 pg (ref 26.0–34.0)
MCHC: 33 g/dL (ref 30.0–36.0)
MCV: 82.1 fL (ref 80.0–100.0)
Monocytes Absolute: 0.6 10*3/uL (ref 0.1–1.0)
Monocytes Relative: 10 %
Neutro Abs: 3.9 10*3/uL (ref 1.7–7.7)
Neutrophils Relative %: 66 %
Platelets: 222 10*3/uL (ref 150–400)
RBC: 4.69 MIL/uL (ref 3.87–5.11)
RDW: 13.5 % (ref 11.5–15.5)
WBC: 6 10*3/uL (ref 4.0–10.5)
nRBC: 0 % (ref 0.0–0.2)

## 2022-02-11 LAB — TYPE AND SCREEN
ABO/RH(D): O POS
Antibody Screen: NEGATIVE

## 2022-02-11 LAB — HEMOGLOBIN A1C
Hgb A1c MFr Bld: 5.3 % (ref 4.8–5.6)
Mean Plasma Glucose: 105.41 mg/dL

## 2022-02-11 LAB — GLUCOSE, CAPILLARY: Glucose-Capillary: 98 mg/dL (ref 70–99)

## 2022-02-16 ENCOUNTER — Telehealth: Payer: Self-pay | Admitting: Dietician

## 2022-02-16 NOTE — Telephone Encounter (Signed)
Pt called called front office with questions about multivitamin and calcium. Dietitian returned call. Pt was wondering if there was multivitamin with calcium that she can get so she can just take one.  Dietitian advised pt that iron in the multivitamin and calcium compete for absorption, so the multivitamin and calcium should be taken separately, with at least 2 hours in between. Dietitian advised that the recommended brands for the multivitamin were the ones in the handouts. Pt expressed understanding. Pt asked if she can juice fruits and vegetables. Dietitian advised that there will be too much sugar in the juice, and would not be in line with goals for success in the program.  Dietitian advised against juicing now and in the future. Pt asked if she can have yukon gold or sweet potatoes, since they are not white potatoes.  Dietitian advised that she not eat any kind of potatoes for pre-op diet, also no potatoes in the early food phases after surgery.  Dietitian advised in the later food phases, she can have them in appropriate serving size. Pt states she with Thanksgiving she has not stuck to the first week of the pre-op diet 100%, stating she was proud of herself for not getting too far off the diet. Pt states she has one week left to follow the pre-op diet, asking what she should do, since the first week wasn't 100%. Dietitian advised that she stick to the pre-op diet for the second week, stating that will help to shrink her liver for surgery. Pt expressed appreciation for taking the time to answer questions.

## 2022-02-17 ENCOUNTER — Other Ambulatory Visit (HOSPITAL_COMMUNITY): Payer: 59

## 2022-02-23 ENCOUNTER — Ambulatory Visit (HOSPITAL_COMMUNITY): Payer: 59 | Admitting: Anesthesiology

## 2022-02-23 ENCOUNTER — Other Ambulatory Visit: Payer: Self-pay

## 2022-02-23 ENCOUNTER — Encounter (HOSPITAL_COMMUNITY)
Admission: RE | Disposition: A | Discharge status: Home / Self Care | Payer: Self-pay | Source: Home / Self Care | Attending: General Surgery

## 2022-02-23 ENCOUNTER — Observation Stay (HOSPITAL_COMMUNITY)
Admission: RE | Admit: 2022-02-23 | Discharge: 2022-02-24 | Disposition: A | Discharge status: Home / Self Care | Payer: 59 | Attending: General Surgery | Admitting: General Surgery

## 2022-02-23 ENCOUNTER — Ambulatory Visit (HOSPITAL_BASED_OUTPATIENT_CLINIC_OR_DEPARTMENT_OTHER): Payer: 59 | Admitting: Anesthesiology

## 2022-02-23 ENCOUNTER — Encounter (HOSPITAL_COMMUNITY): Payer: Self-pay | Admitting: General Surgery

## 2022-02-23 DIAGNOSIS — B9681 Helicobacter pylori [H. pylori] as the cause of diseases classified elsewhere: Secondary | ICD-10-CM | POA: Insufficient documentation

## 2022-02-23 DIAGNOSIS — M5459 Other low back pain: Secondary | ICD-10-CM | POA: Diagnosis not present

## 2022-02-23 DIAGNOSIS — E559 Vitamin D deficiency, unspecified: Secondary | ICD-10-CM | POA: Diagnosis not present

## 2022-02-23 DIAGNOSIS — Z6841 Body Mass Index (BMI) 40.0 and over, adult: Secondary | ICD-10-CM | POA: Insufficient documentation

## 2022-02-23 DIAGNOSIS — I10 Essential (primary) hypertension: Secondary | ICD-10-CM | POA: Insufficient documentation

## 2022-02-23 DIAGNOSIS — M25562 Pain in left knee: Secondary | ICD-10-CM | POA: Insufficient documentation

## 2022-02-23 DIAGNOSIS — E119 Type 2 diabetes mellitus without complications: Secondary | ICD-10-CM | POA: Diagnosis not present

## 2022-02-23 DIAGNOSIS — O9902 Anemia complicating childbirth: Secondary | ICD-10-CM

## 2022-02-23 DIAGNOSIS — M25561 Pain in right knee: Secondary | ICD-10-CM | POA: Diagnosis not present

## 2022-02-23 DIAGNOSIS — R7303 Prediabetes: Secondary | ICD-10-CM

## 2022-02-23 DIAGNOSIS — Z9884 Bariatric surgery status: Secondary | ICD-10-CM

## 2022-02-23 HISTORY — PX: UPPER GI ENDOSCOPY: SHX6162

## 2022-02-23 HISTORY — PX: LAPAROSCOPIC GASTRIC SLEEVE RESECTION: SHX5895

## 2022-02-23 LAB — POCT PREGNANCY, URINE: Preg Test, Ur: NEGATIVE

## 2022-02-23 LAB — CBC
HCT: 39.5 % (ref 36.0–46.0)
Hemoglobin: 13.1 g/dL (ref 12.0–15.0)
MCH: 27.2 pg (ref 26.0–34.0)
MCHC: 33.2 g/dL (ref 30.0–36.0)
MCV: 82.1 fL (ref 80.0–100.0)
Platelets: 208 10*3/uL (ref 150–400)
RBC: 4.81 MIL/uL (ref 3.87–5.11)
RDW: 13.8 % (ref 11.5–15.5)
WBC: 12.9 10*3/uL — ABNORMAL HIGH (ref 4.0–10.5)
nRBC: 0 % (ref 0.0–0.2)

## 2022-02-23 LAB — CREATININE, SERUM
Creatinine, Ser: 0.93 mg/dL (ref 0.44–1.00)
GFR, Estimated: >60 mL/min (ref >60)

## 2022-02-23 LAB — HEMOGLOBIN AND HEMATOCRIT, BLOOD
HCT: 38.8 % (ref 36.0–46.0)
Hemoglobin: 13 g/dL (ref 12.0–15.0)

## 2022-02-23 SURGERY — GASTRECTOMY, SLEEVE, LAPAROSCOPIC
Anesthesia: General

## 2022-02-23 MED ORDER — ROCURONIUM BROMIDE 10 MG/ML (PF) SYRINGE
PREFILLED_SYRINGE | INTRAVENOUS | Status: DC | PRN
  Administered 2022-02-23 (×2): 10 mg via INTRAVENOUS
  Administered 2022-02-23: 70 mg via INTRAVENOUS

## 2022-02-23 MED ORDER — PHENYLEPHRINE HCL-NACL 20-0.9 MG/250ML-% IV SOLN
INTRAVENOUS | Status: DC | PRN
  Administered 2022-02-23: 35 ug/min via INTRAVENOUS

## 2022-02-23 MED ORDER — HYDRALAZINE HCL 20 MG/ML IJ SOLN: 10.0000 mg | INTRAMUSCULAR | Status: DC | PRN

## 2022-02-23 MED ORDER — SUGAMMADEX SODIUM 200 MG/2ML IV SOLN
INTRAVENOUS | Status: DC | PRN
  Administered 2022-02-23: 400 mg via INTRAVENOUS

## 2022-02-23 MED ORDER — STERILE WATER FOR IRRIGATION IR SOLN
Status: DC | PRN
  Administered 2022-02-23 (×2): 1000 mL

## 2022-02-23 MED ORDER — ONDANSETRON HCL 4 MG/2ML IJ SOLN
INTRAMUSCULAR | Status: DC | PRN
  Administered 2022-02-23: 4 mg via INTRAVENOUS

## 2022-02-23 MED ORDER — ACETAMINOPHEN 160 MG/5ML PO SOLN: 1000.0000 mg | Freq: Three times a day (TID) | ORAL | Status: DC

## 2022-02-23 MED ORDER — SUGAMMADEX SODIUM 500 MG/5ML IV SOLN
INTRAVENOUS | Status: AC
  Filled 2022-02-23: qty 5

## 2022-02-23 MED ORDER — ONDANSETRON HCL 4 MG/2ML IJ SOLN: 4.0000 mg | Freq: Four times a day (QID) | INTRAMUSCULAR | Status: DC | PRN

## 2022-02-23 MED ORDER — CHLORHEXIDINE GLUCONATE 4 % EX LIQD: Freq: Once | CUTANEOUS | Status: DC

## 2022-02-23 MED ORDER — SIMETHICONE 80 MG PO CHEW: 80.0000 mg | CHEWABLE_TABLET | Freq: Four times a day (QID) | ORAL | Status: DC | PRN

## 2022-02-23 MED ORDER — DEXAMETHASONE SODIUM PHOSPHATE 10 MG/ML IJ SOLN
INTRAMUSCULAR | Status: DC | PRN
  Administered 2022-02-23: 10 mg via INTRAVENOUS

## 2022-02-23 MED ORDER — BUPIVACAINE-EPINEPHRINE (PF) 0.5% -1:200000 IJ SOLN
INTRAMUSCULAR | Status: DC | PRN
  Administered 2022-02-23: 30 mL

## 2022-02-23 MED ORDER — PROPOFOL 10 MG/ML IV BOLUS
INTRAVENOUS | Status: DC | PRN
  Administered 2022-02-23: 170 mg via INTRAVENOUS

## 2022-02-23 MED ORDER — 0.9 % SODIUM CHLORIDE (POUR BTL) OPTIME
TOPICAL | Status: DC | PRN
  Administered 2022-02-23: 1000 mL

## 2022-02-23 MED ORDER — MIDAZOLAM HCL 2 MG/2ML IJ SOLN
INTRAMUSCULAR | Status: AC
  Filled 2022-02-23: qty 2

## 2022-02-23 MED ORDER — KETAMINE HCL 10 MG/ML IJ SOLN
INTRAMUSCULAR | Status: DC | PRN
  Administered 2022-02-23: 30 mg via INTRAVENOUS
  Administered 2022-02-23: 20 mg via INTRAVENOUS

## 2022-02-23 MED ORDER — LACTATED RINGERS IV SOLN: INTRAVENOUS | Status: DC

## 2022-02-23 MED ORDER — FENTANYL CITRATE PF 50 MCG/ML IJ SOSY
PREFILLED_SYRINGE | INTRAMUSCULAR | Status: AC
  Filled 2022-02-23: qty 1

## 2022-02-23 MED ORDER — ONDANSETRON HCL 4 MG/2ML IJ SOLN
INTRAMUSCULAR | Status: AC
  Filled 2022-02-23: qty 2

## 2022-02-23 MED ORDER — MIDAZOLAM HCL 5 MG/5ML IJ SOLN
INTRAMUSCULAR | Status: DC | PRN
  Administered 2022-02-23: 2 mg via INTRAVENOUS

## 2022-02-23 MED ORDER — OXYCODONE HCL 5 MG/5ML PO SOLN
5.0000 mg | Freq: Four times a day (QID) | ORAL | Status: DC | PRN
  Administered 2022-02-23 – 2022-02-24 (×3): 5 mg via ORAL
  Filled 2022-02-23 (×3): qty 5

## 2022-02-23 MED ORDER — SODIUM CHLORIDE 0.9 % IV SOLN
2.0000 g | INTRAVENOUS | Status: AC
  Administered 2022-02-23: 2 g via INTRAVENOUS
  Filled 2022-02-23: qty 2

## 2022-02-23 MED ORDER — CHLORHEXIDINE GLUCONATE 0.12 % MT SOLN
15.0000 mL | Freq: Once | OROMUCOSAL | Status: AC
  Administered 2022-02-23: 15 mL via OROMUCOSAL

## 2022-02-23 MED ORDER — LOSARTAN POTASSIUM 50 MG PO TABS
100.0000 mg | ORAL_TABLET | Freq: Every day | ORAL | Status: DC
  Administered 2022-02-24: 100 mg via ORAL
  Filled 2022-02-23: qty 2

## 2022-02-23 MED ORDER — LIDOCAINE 2% (20 MG/ML) 5 ML SYRINGE
INTRAMUSCULAR | Status: DC | PRN
  Administered 2022-02-23: 1.5 mg/kg/h via INTRAVENOUS
  Administered 2022-02-23: 80 mg via INTRAVENOUS

## 2022-02-23 MED ORDER — SCOPOLAMINE 1 MG/3DAYS TD PT72
1.0000 | MEDICATED_PATCH | TRANSDERMAL | Status: DC
  Administered 2022-02-23: 1.5 mg via TRANSDERMAL
  Filled 2022-02-23: qty 1

## 2022-02-23 MED ORDER — ROCURONIUM BROMIDE 10 MG/ML (PF) SYRINGE
PREFILLED_SYRINGE | INTRAVENOUS | Status: AC
  Filled 2022-02-23: qty 10

## 2022-02-23 MED ORDER — HEPARIN SODIUM (PORCINE) 5000 UNIT/ML IJ SOLN
5000.0000 [IU] | Freq: Three times a day (TID) | INTRAMUSCULAR | Status: DC
  Administered 2022-02-23 – 2022-02-24 (×2): 5000 [IU] via SUBCUTANEOUS
  Filled 2022-02-23 (×2): qty 1

## 2022-02-23 MED ORDER — FENTANYL CITRATE PF 50 MCG/ML IJ SOSY
PREFILLED_SYRINGE | INTRAMUSCULAR | Status: AC
  Filled 2022-02-23: qty 2

## 2022-02-23 MED ORDER — AMLODIPINE BESYLATE 10 MG PO TABS
10.0000 mg | ORAL_TABLET | Freq: Every day | ORAL | Status: DC
  Administered 2022-02-24: 10 mg via ORAL
  Filled 2022-02-23: qty 1

## 2022-02-23 MED ORDER — BUPIVACAINE LIPOSOME 1.3 % IJ SUSP: 20.0000 mL | Freq: Once | INTRAMUSCULAR | Status: DC

## 2022-02-23 MED ORDER — LIDOCAINE HCL (PF) 2 % IJ SOLN
INTRAMUSCULAR | Status: AC
  Filled 2022-02-23: qty 5

## 2022-02-23 MED ORDER — PROPOFOL 10 MG/ML IV BOLUS
INTRAVENOUS | Status: AC
  Filled 2022-02-23: qty 20

## 2022-02-23 MED ORDER — FENTANYL CITRATE PF 50 MCG/ML IJ SOSY
25.0000 ug | PREFILLED_SYRINGE | INTRAMUSCULAR | Status: DC | PRN
  Administered 2022-02-23 (×2): 50 ug via INTRAVENOUS

## 2022-02-23 MED ORDER — HEMOSTATIC AGENTS (NO CHARGE) OPTIME
TOPICAL | Status: DC | PRN
  Administered 2022-02-23: 1

## 2022-02-23 MED ORDER — ENSURE MAX PROTEIN PO LIQD
2.0000 [oz_av] | ORAL | Status: DC
  Administered 2022-02-24 (×5): 2 [oz_av] via ORAL

## 2022-02-23 MED ORDER — ACETAMINOPHEN 500 MG PO TABS
1000.0000 mg | ORAL_TABLET | ORAL | Status: AC
  Administered 2022-02-23: 1000 mg via ORAL
  Filled 2022-02-23: qty 2

## 2022-02-23 MED ORDER — BUPIVACAINE LIPOSOME 1.3 % IJ SUSP
INTRAMUSCULAR | Status: AC
  Filled 2022-02-23: qty 20

## 2022-02-23 MED ORDER — ACETAMINOPHEN 500 MG PO TABS
1000.0000 mg | ORAL_TABLET | Freq: Three times a day (TID) | ORAL | Status: DC
  Administered 2022-02-23 – 2022-02-24 (×3): 1000 mg via ORAL
  Filled 2022-02-23 (×3): qty 2

## 2022-02-23 MED ORDER — KCL IN DEXTROSE-NACL 20-5-0.45 MEQ/L-%-% IV SOLN
INTRAVENOUS | Status: DC
  Filled 2022-02-23 (×2): qty 1000

## 2022-02-23 MED ORDER — LACTATED RINGERS IR SOLN
Status: DC | PRN
  Administered 2022-02-23: 1000 mL

## 2022-02-23 MED ORDER — PANTOPRAZOLE SODIUM 40 MG IV SOLR
40.0000 mg | Freq: Every day | INTRAVENOUS | Status: DC
  Administered 2022-02-23: 40 mg via INTRAVENOUS
  Filled 2022-02-23: qty 10

## 2022-02-23 MED ORDER — DEXAMETHASONE SODIUM PHOSPHATE 4 MG/ML IJ SOLN: 4.0000 mg | INTRAMUSCULAR | Status: DC

## 2022-02-23 MED ORDER — LOSARTAN POTASSIUM-HCTZ 100-25 MG PO TABS: 1.0000 | ORAL_TABLET | Freq: Every day | ORAL | Status: DC

## 2022-02-23 MED ORDER — MORPHINE SULFATE (PF) 2 MG/ML IV SOLN
1.0000 mg | INTRAVENOUS | Status: DC | PRN
  Administered 2022-02-23: 2 mg via INTRAVENOUS
  Filled 2022-02-23: qty 1

## 2022-02-23 MED ORDER — HEPARIN SODIUM (PORCINE) 5000 UNIT/ML IJ SOLN
5000.0000 [IU] | INTRAMUSCULAR | Status: AC
  Administered 2022-02-23: 5000 [IU] via SUBCUTANEOUS
  Filled 2022-02-23: qty 1

## 2022-02-23 MED ORDER — ORAL CARE MOUTH RINSE: 15.0000 mL | Freq: Once | OROMUCOSAL | Status: AC

## 2022-02-23 MED ORDER — SODIUM CHLORIDE 0.9 % IV SOLN: 12.5000 mg | Freq: Four times a day (QID) | INTRAVENOUS | Status: DC | PRN

## 2022-02-23 MED ORDER — BUPIVACAINE-EPINEPHRINE (PF) 0.5% -1:200000 IJ SOLN
INTRAMUSCULAR | Status: AC
  Filled 2022-02-23: qty 30

## 2022-02-23 MED ORDER — DEXAMETHASONE SODIUM PHOSPHATE 10 MG/ML IJ SOLN
INTRAMUSCULAR | Status: AC
  Filled 2022-02-23: qty 1

## 2022-02-23 MED ORDER — BUPIVACAINE LIPOSOME 1.3 % IJ SUSP
INTRAMUSCULAR | Status: DC | PRN
  Administered 2022-02-23: 20 mL

## 2022-02-23 MED ORDER — FENTANYL CITRATE (PF) 250 MCG/5ML IJ SOLN
INTRAMUSCULAR | Status: DC | PRN
  Administered 2022-02-23: 100 ug via INTRAVENOUS
  Administered 2022-02-23 (×3): 50 ug via INTRAVENOUS

## 2022-02-23 MED ORDER — HYDROCHLOROTHIAZIDE 25 MG PO TABS
25.0000 mg | ORAL_TABLET | Freq: Every day | ORAL | Status: DC
  Administered 2022-02-24: 25 mg via ORAL
  Filled 2022-02-23: qty 1

## 2022-02-23 MED ORDER — DIPHENHYDRAMINE HCL 50 MG/ML IJ SOLN: 12.5000 mg | Freq: Three times a day (TID) | INTRAMUSCULAR | Status: DC | PRN

## 2022-02-23 MED ORDER — FENTANYL CITRATE (PF) 250 MCG/5ML IJ SOLN
INTRAMUSCULAR | Status: AC
  Filled 2022-02-23: qty 5

## 2022-02-23 MED ORDER — APREPITANT 40 MG PO CAPS
40.0000 mg | ORAL_CAPSULE | ORAL | Status: AC
  Administered 2022-02-23: 40 mg via ORAL
  Filled 2022-02-23: qty 1

## 2022-02-23 SURGICAL SUPPLY — 77 items
ANTIFOG SOL W/FOAM PAD STRL (MISCELLANEOUS) ×1
APPLICATOR COTTON TIP 6 STRL (MISCELLANEOUS) IMPLANT
APPLICATOR COTTON TIP 6IN STRL (MISCELLANEOUS)
APPLIER CLIP ROT 10 11.4 M/L (STAPLE)
APPLIER CLIP ROT 13.4 12 LRG (CLIP)
BAG COUNTER SPONGE SURGICOUNT (BAG) IMPLANT
BLADE SURG SZ11 CARB STEEL (BLADE) ×1 IMPLANT
CABLE HIGH FREQUENCY MONO STRZ (ELECTRODE) IMPLANT
CHLORAPREP W/TINT 26 (MISCELLANEOUS) ×2 IMPLANT
CLIP APPLIE ROT 10 11.4 M/L (STAPLE) IMPLANT
CLIP APPLIE ROT 13.4 12 LRG (CLIP) IMPLANT
COVER SURGICAL LIGHT HANDLE (MISCELLANEOUS) ×1 IMPLANT
DEVICE SUT QUICK LOAD TK 5 (SUTURE) IMPLANT
DEVICE SUT TI-KNOT TK 5X26 (SUTURE) IMPLANT
DEVICE SUTURE ENDOST 10MM (ENDOMECHANICALS) IMPLANT
DISSECTOR BLUNT TIP ENDO 5MM (MISCELLANEOUS) IMPLANT
DRAPE UTILITY XL STRL (DRAPES) ×2 IMPLANT
DRSG TEGADERM 2-3/8X2-3/4 SM (GAUZE/BANDAGES/DRESSINGS) ×6 IMPLANT
ELECT L-HOOK LAP 45CM DISP (ELECTROSURGICAL)
ELECT REM PT RETURN 15FT ADLT (MISCELLANEOUS) ×1 IMPLANT
ELECTRODE L-HOOK LAP 45CM DISP (ELECTROSURGICAL) IMPLANT
GAUZE SPONGE 2X2 8PLY STRL LF (GAUZE/BANDAGES/DRESSINGS) IMPLANT
GAUZE SPONGE 4X4 12PLY STRL (GAUZE/BANDAGES/DRESSINGS) IMPLANT
GLOVE BIO SURGEON STRL SZ7.5 (GLOVE) ×1 IMPLANT
GLOVE INDICATOR 8.0 STRL GRN (GLOVE) ×1 IMPLANT
GOWN STRL REUS W/ TWL XL LVL3 (GOWN DISPOSABLE) ×3 IMPLANT
GOWN STRL REUS W/TWL XL LVL3 (GOWN DISPOSABLE) ×3
GRASPER SUT TROCAR 14GX15 (MISCELLANEOUS) ×1 IMPLANT
HEMOSTAT SNOW SURGICEL 2X4 (HEMOSTASIS) IMPLANT
IRRIG SUCT STRYKERFLOW 2 WTIP (MISCELLANEOUS) ×1
IRRIGATION SUCT STRKRFLW 2 WTP (MISCELLANEOUS) ×1 IMPLANT
KIT BASIN OR (CUSTOM PROCEDURE TRAY) ×1 IMPLANT
KIT TURNOVER KIT A (KITS) IMPLANT
MARKER SKIN DUAL TIP RULER LAB (MISCELLANEOUS) ×1 IMPLANT
MAT PREVALON FULL STRYKER (MISCELLANEOUS) ×1 IMPLANT
NDL SPNL 22GX3.5 QUINCKE BK (NEEDLE) ×1 IMPLANT
NEEDLE SPNL 22GX3.5 QUINCKE BK (NEEDLE) ×1 IMPLANT
PACK UNIVERSAL I (CUSTOM PROCEDURE TRAY) ×1 IMPLANT
PENCIL SMOKE EVACUATOR (MISCELLANEOUS) IMPLANT
RELOAD STAPLE 60 3.6 BLU REG (STAPLE) ×1 IMPLANT
RELOAD STAPLE 60 3.8 GOLD REG (STAPLE) IMPLANT
RELOAD STAPLE 60 4.1 GRN THCK (STAPLE) ×1 IMPLANT
RELOAD STAPLE 60 BLK VRY/THCK (STAPLE) IMPLANT
RELOAD STAPLER 60MM BLK (STAPLE) IMPLANT
RELOAD STAPLER BLUE 60MM (STAPLE) ×5 IMPLANT
RELOAD STAPLER GOLD 60MM (STAPLE) ×1 IMPLANT
RELOAD STAPLER GREEN 60MM (STAPLE) IMPLANT
SCISSORS LAP 5X45 EPIX DISP (ENDOMECHANICALS) IMPLANT
SEALANT SURGICAL APPL DUAL CAN (MISCELLANEOUS) IMPLANT
SET TUBE SMOKE EVAC HIGH FLOW (TUBING) ×1 IMPLANT
SHEARS HARMONIC ACE PLUS 45CM (MISCELLANEOUS) ×1 IMPLANT
SLEEVE ADV FIXATION 5X100MM (TROCAR) ×2 IMPLANT
SLEEVE GASTRECTOMY 40FR VISIGI (MISCELLANEOUS) ×1 IMPLANT
SOLUTION ANTFG W/FOAM PAD STRL (MISCELLANEOUS) ×1 IMPLANT
SPIKE FLUID TRANSFER (MISCELLANEOUS) ×1 IMPLANT
STAPLE LINE REINFORCEMENT LAP (STAPLE) IMPLANT
STAPLER ECHELON BIOABSB 60 FLE (MISCELLANEOUS) IMPLANT
STAPLER ECHELON LONG 60 440 (INSTRUMENTS) ×1 IMPLANT
STAPLER RELOAD 60MM BLK (STAPLE)
STAPLER RELOAD BLUE 60MM (STAPLE) ×5
STAPLER RELOAD GOLD 60MM (STAPLE) ×1
STAPLER RELOAD GREEN 60MM (STAPLE)
STRIP CLOSURE SKIN 1/2X4 (GAUZE/BANDAGES/DRESSINGS) ×1 IMPLANT
SUT MNCRL AB 4-0 PS2 18 (SUTURE) ×1 IMPLANT
SUT SURGIDAC NAB ES-9 0 48 120 (SUTURE) IMPLANT
SUT VICRYL 0 TIES 12 18 (SUTURE) ×1 IMPLANT
SYR 20ML LL LF (SYRINGE) ×1 IMPLANT
SYR 50ML LL SCALE MARK (SYRINGE) ×1 IMPLANT
SYS KII OPTICAL ACCESS 15MM (TROCAR) ×1
SYSTEM KII OPTICAL ACCESS 15MM (TROCAR) ×1 IMPLANT
TOWEL OR 17X26 10 PK STRL BLUE (TOWEL DISPOSABLE) ×1 IMPLANT
TOWEL OR NON WOVEN STRL DISP B (DISPOSABLE) ×1 IMPLANT
TROCAR ADV FIXATION 5X100MM (TROCAR) ×1 IMPLANT
TROCAR XCEL NON-BLD 5MMX100MML (ENDOMECHANICALS) ×1 IMPLANT
TROCAR Z-THREAD OPTICAL 5X100M (TROCAR) IMPLANT
TUBING CONNECTING 10 (TUBING) ×2 IMPLANT
TUBING ENDO SMARTCAP (MISCELLANEOUS) ×1 IMPLANT

## 2022-02-23 NOTE — Anesthesia Procedure Notes (Signed)
Procedure Name: Intubation Date/Time: 02/23/2022 10:38 AM  Performed by: Maxwell Caul, CRNAPre-anesthesia Checklist: Patient identified, Emergency Drugs available, Suction available and Patient being monitored Patient Re-evaluated:Patient Re-evaluated prior to induction Oxygen Delivery Method: Circle system utilized Preoxygenation: Pre-oxygenation with 100% oxygen Induction Type: IV induction Ventilation: Mask ventilation without difficulty Laryngoscope Size: Mac and 4 Grade View: Grade I Tube type: Oral Tube size: 7.0 mm Number of attempts: 1 Airway Equipment and Method: Stylet Placement Confirmation: ETT inserted through vocal cords under direct vision, positive ETCO2 and breath sounds checked- equal and bilateral Secured at: 21 cm Tube secured with: Tape Dental Injury: Teeth and Oropharynx as per pre-operative assessment

## 2022-02-23 NOTE — Interval H&P Note (Signed)
History and Physical Interval Note:  02/23/2022 9:55 AM  Madeline Cooke  has presented today for surgery, with the diagnosis of MORBID OBESITY.  The various methods of treatment have been discussed with the patient and family. After consideration of risks, benefits and other options for treatment, the patient has consented to  Procedure(s): LAPAROSCOPIC SLEEVE GASTRECTOMY (N/A) UPPER GI ENDOSCOPY (N/A) as a surgical intervention.  The patient's history has been reviewed, patient examined, no change in status, stable for surgery.  I have reviewed the patient's chart and labs.  Questions were answered to the patient's satisfaction.    Pt placed on addl med for BP control. O/w no changes Gaynelle Adu

## 2022-02-23 NOTE — Progress Notes (Signed)
PHARMACY CONSULT FOR:  Risk Assessment for Post-Discharge VTE Following Bariatric Surgery  Post-Discharge VTE Risk Assessment: This patient's probability of 30-day post-discharge VTE is increased due to the factors marked: x Sleeve gastrectomy   Liver disorder (transplant, cirrhosis, or nonalcoholic steatohepatitis)   Hx of VTE   Hemorrhage requiring transfusion   GI perforation, leak, or obstruction   ====================================================    Female    Age >/=60 years    BMI >/=50 kg/m2    CHF    Dyspnea at Rest    Paraplegia  x  Non-gastric-band surgery    Operation Time >/=3 hr    Return to OR     Length of Stay >/= 3 d   Hypercoagulable condition   Significant venous stasis   Predicted probability of 30-day post-discharge VTE: 0.16%  Recommendation for Discharge: No pharmacologic prophylaxis post-discharge  Madeline Cooke is a 36 y.o. female who underwent sleeve gastrectomy on 02/23/22   Case start: 1059 Case end: 1217  No Known Allergies  Patient Measurements: Weight: 123.7 kg (272 lb 9.6 oz) Body mass index is 48.29 kg/m.  No results for input(s): "WBC", "HGB", "HCT", "PLT", "APTT", "CREATININE", "LABCREA", "CREAT24HRUR", "MG", "PHOS", "ALBUMIN", "PROT", "AST", "ALT", "ALKPHOS", "BILITOT", "BILIDIR", "IBILI" in the last 72 hours. Estimated Creatinine Clearance: 108.2 mL/min (by C-G formula based on SCr of 0.9 mg/dL).    Past Medical History:  Diagnosis Date   Anemia    Arthritis    Benign essential HTN, chronic, antepartum, third trimester 12/27/2018   Essential hypertension 06/16/2019   History of kidney stones    Morbid obesity (HCC) 06/16/2019   Pre-diabetes    Sickle cell trait (HCC)    Skin lesion 12/27/2018   Left buttock     Medications Prior to Admission  Medication Sig Dispense Refill Last Dose   Acetaminophen-Caff-Pyrilamine (MIDOL COMPLETE) 500-60-15 MG TABS Take 1 tablet by mouth daily.   Past Month   amLODipine  (NORVASC) 10 MG tablet Take 10 mg by mouth daily.   02/23/2022 at 0700   Chlorphen-Pseudoephed-APAP (THERAFLU FLU/COLD PO) Take 1 Package by mouth daily as needed (Cold). Severe with 8 oz of water   Past Month   Cholecalciferol (VITAMIN D-3) 125 MCG (5000 UT) TABS Take 5,000 Units by mouth daily.   02/22/2022   Flaxseed, Linseed, (FLAX SEED OIL) 1000 MG CAPS Take 1,000 mg by mouth daily.   Past Week   losartan-hydrochlorothiazide (HYZAAR) 100-25 MG tablet Take 1 tablet by mouth daily.   02/22/2022   Menthol, Topical Analgesic, (BIOFREEZE) 4 % GEL Apply 1 Application topically at bedtime.   Past Month   spironolactone (ALDACTONE) 25 MG tablet Take 25 mg by mouth daily.   02/22/2022   Coenzyme Q10 (CO Q 10) 100 MG CAPS Take 100 mg by mouth daily.   More than a month   diclofenac (VOLTAREN) 75 MG EC tablet Take 75 mg by mouth daily as needed for moderate pain or mild pain.   More than a month   ergocalciferol (VITAMIN D2) 1.25 MG (50000 UT) capsule Take 50,000 Units by mouth once a week.   02/21/2022    Cindi Carbon, PharmD 02/23/2022,2:28 PM

## 2022-02-23 NOTE — Discharge Instructions (Signed)

## 2022-02-23 NOTE — Progress Notes (Signed)
Discussed QI "Goals for Discharge" document with patient including ambulation in halls, Incentive Spirometry use every hour, and oral care.  Also discussed pain and nausea control.  BSTOP education provided including BSTOP information guide, "Guide for Pain Management after your Bariatric Procedure".  Diet progression education provided including "Bariatric Surgery Post-Op Food Plan Phase 1: Liquids".  Questions answered.  Will continue to partner with bedside RN and follow up with patient per protocol.  

## 2022-02-23 NOTE — Anesthesia Preprocedure Evaluation (Signed)
Anesthesia Evaluation  Patient identified by MRN, date of birth, ID band Patient awake    Reviewed: Allergy & Precautions, NPO status , Patient's Chart, lab work & pertinent test results  Airway Mallampati: II  TM Distance: >3 FB Neck ROM: Full    Dental no notable dental hx.    Pulmonary neg pulmonary ROS   Pulmonary exam normal        Cardiovascular hypertension, Pt. on medications  Rhythm:Regular Rate:Normal     Neuro/Psych negative neurological ROS  negative psych ROS   GI/Hepatic Neg liver ROS,,,  Endo/Other  negative endocrine ROS  Morbid obesity  Renal/GU negative Renal ROS  negative genitourinary   Musculoskeletal  (+) Arthritis ,    Abdominal Normal abdominal exam  (+)   Peds  Hematology  (+) Blood dyscrasia, Sickle cell trait and anemia   Anesthesia Other Findings   Reproductive/Obstetrics                             Anesthesia Physical Anesthesia Plan  ASA: 3  Anesthesia Plan: General   Post-op Pain Management: Tylenol PO (pre-op)*   Induction: Intravenous  PONV Risk Score and Plan: 3 and Ondansetron, Dexamethasone, Midazolam and Treatment may vary due to age or medical condition  Airway Management Planned: Mask and Oral ETT  Additional Equipment: None  Intra-op Plan:   Post-operative Plan: Extubation in OR  Informed Consent: I have reviewed the patients History and Physical, chart, labs and discussed the procedure including the risks, benefits and alternatives for the proposed anesthesia with the patient or authorized representative who has indicated his/her understanding and acceptance.     Dental advisory given  Plan Discussed with: CRNA  Anesthesia Plan Comments: (Lab Results      Component                Value               Date                      WBC                      6.0                 02/11/2022                HGB                      12.7                 02/11/2022                HCT                      38.5                02/11/2022                MCV                      82.1                02/11/2022                PLT  222                 02/11/2022            Lab Results      Component                Value               Date                      NA                       139                 02/11/2022                K                        3.2 (L)             02/11/2022                CO2                      28                  02/11/2022                GLUCOSE                  103 (H)             02/11/2022                BUN                      15                  02/11/2022                CREATININE               0.90                02/11/2022                CALCIUM                  9.2                 02/11/2022                GFRNONAA                 >60                 02/11/2022           )       Anesthesia Quick Evaluation

## 2022-02-23 NOTE — Anesthesia Postprocedure Evaluation (Signed)
Anesthesia Post Note  Patient: Madeline Cooke  Procedure(s) Performed: LAPAROSCOPIC SLEEVE GASTRECTOMY AND HIATAL HERNIA REPAIR UPPER GI ENDOSCOPY     Patient location during evaluation: PACU Anesthesia Type: General Level of consciousness: awake and alert Pain management: pain level controlled Vital Signs Assessment: post-procedure vital signs reviewed and stable Respiratory status: spontaneous breathing, nonlabored ventilation, respiratory function stable and patient connected to nasal cannula oxygen Cardiovascular status: blood pressure returned to baseline and stable Postop Assessment: no apparent nausea or vomiting Anesthetic complications: no   No notable events documented.  Last Vitals:  Vitals:   02/23/22 1515 02/23/22 1612  BP: (!) 142/90 (!) 162/103  Pulse: 91 99  Resp:    Temp: 36.8 C 36.8 C  SpO2: 97% 97%    Last Pain:  Vitals:   02/23/22 1410  TempSrc:   PainSc: 9                  Ngan Qualls P Gilmer Kaminsky

## 2022-02-23 NOTE — H&P (Signed)
PROVIDER: Philisha Weinel Sherril Cong, MD  MRN: [id number] DOB: May 10, 1985 DATE OF ENCOUNTER: 02/05/2022 Subjective  Chief Complaint: Bariatric pre-op (Sleeve)   History of Present Illness: Madeline Cooke is a 36 y.o. female who is seen today for severe obesity and related comorbidities..  Comorbidities include hypertension, chronic bilateral knee pain, chronic low back pain without sciatica.  Her biggest issue since I last saw her has been her blood pressure. She does admit that she forgot to take her blood pressure medicine with her when she went out of town and she did not have any for few days so her blood pressure has been higher than usual. She has a longstanding history of chronic hypertension. She is working closely with her PCP and has a follow-up appointment next Friday. She denies any blurry vision or headaches. She denies any chest pain. Otherwise she denies any medical changes since I last saw her. She has received nutritional and psychological evaluation and clearance.  Review of Systems: A complete review of systems was obtained from the patient. I have reviewed this information and discussed as appropriate with the patient. See HPI as well for other ROS.  ROS  Medical History: Past Medical History: Diagnosis Date Anemia Diabetes mellitus without complication (CMS-HCC) Hypertension  Patient Active Problem List Diagnosis Essential hypertension  Past Surgical History: Procedure Laterality Date mole removed   No Known Allergies  Current Outpatient Medications on File Prior to Visit Medication Sig Dispense Refill amLODIPine (NORVASC) 10 MG tablet Take 1 tablet by mouth once daily hydrALAZINE (APRESOLINE) 10 MG tablet Take 10 mg by mouth 3 (three) times daily losartan-hydroCHLOROthiazide (HYZAAR) 100-12.5 mg tablet spironolactone (ALDACTONE) 50 MG tablet Take 50 mg by mouth once daily iron polysaccharides (FERREX) 150 mg iron capsule Take 150 mg by mouth once daily  (Patient not taking: Reported on 02/05/2022) valsartan-hydroCHLOROthiazide (DIOVAN-HCT) 320-25 mg tablet Take 1 tablet by mouth once daily (Patient not taking: Reported on 02/05/2022)  No current facility-administered medications on file prior to visit.  History reviewed. No pertinent family history.  Social History  Tobacco Use Smoking Status Never Smokeless Tobacco Never   Social History  Socioeconomic History Marital status: Married Tobacco Use Smoking status: Never Smokeless tobacco: Never Substance and Sexual Activity Alcohol use: Not Currently Drug use: Never  Objective:  Vitals: 02/05/22 1546 BP: (!) 140/96 Pulse: 102 Temp: 36.9 C (98.4 F) SpO2: 99% Weight: (!) 123.2 kg (271 lb 9.6 oz) Height: 160 cm (5\' 3" )  Body mass index is 48.11 kg/m.  Gen: alert, NAD, non-toxic appearing, severe obesity Pupils: equal, no scleral icterus Pulm: Lungs clear to auscultation, symmetric chest rise CV: regular rate and rhythm Abd: soft, nontender, nondistended. No cellulitis. No incisional hernia Ext: no edema, Skin: no rash, no jaundice  Labs, Imaging and Diagnostic Testing:  Weight Mgmt Note 12/04/21, 10/16/21; 09/03/21, 09/18/21, 09/22/21 [facility] Behaviorist Note 11/07/21, 10/03/21, 08/07/21  Basic panel May 12 was normal except for potassium of 3.3  Lipid panel May 19, 2021 showed total cholesterol 165, triglycerides 60, HDL 50, LDL mildly elevated at 100; hemoglobin A1c 5.2  Labs 12/25/21 Ferritin 19 H pylori breath test positive Vit d 16.1 CMET ok cbc ok  Upper gi 01/02/22  FINDINGS: [facility] Radiograph: Normal bowel gas pattern. No extraluminal air noted. IUD overlies the pelvis.  Esophagus: Normal appearance.  Esophageal motility: Within normal limits.  Gastroesophageal reflux: None visualized.  Ingested 13 mm barium tablet: Passed normally  Stomach: Normal appearance. No hiatal hernia.  Gastric emptying:  Normal.  Duodenum: Normal appearance.  Other: None.  IMPRESSION: Normal upper GI exam.  CXR 01/02/22 - wnl  Dr Lanae Boast sports medicine office note 01/12/22  Cardiology office note 06/16/19 Aldosterone renin ratio elevated at 104 on June 16, 2019  She had an adrenal CT October 25, 2019 which showed no mass or abnormality of the adrenal glands. Small nonobstructive calculus of the right kidney no hydro-.  Renal artery ultrasound April 2021 normal blood flow to the renal arteries Assessment and Plan: Diagnoses and all orders for this visit:  Severe obesity (CMS-HCC)  Essential hypertension  Chronic midline low back pain without sciatica  Bilateral chronic knee pain  Vitamin D deficiency  Positive H. pylori test    She is working closely with her primary care doctor to try to improve her blood pressure although this is been a longstanding issue. I did stress to her multiple times the importance of taking her blood pressure medicine on a consistent basis. Initial blood pressure today was 140/110 but when we rechecked it at the end of the visit it was 140/96.  Follow-up next Friday with her primary care doctor to follow-up on her blood pressure.  We discussed the rest of her work-up and evaluation. Discussed the importance of the preoperative meal plan. Her adrenal CT showed no evidence of masses. We rediscussed the typical hospitalization and the typical recovery. I offered to rediscuss steps along with risk and benefits but she declined.  We discussed the possibility of her surgery having to be postponed should her blood pressure remain that elevated. We will see what it is the morning of surgery and go from there but hopefully with her being consistent with her blood pressure medicine it will improve.  Vitamin D deficiency we will place her on 50,000 units once a week for 4 weeks  Respect to her positive H. pylori breath test. We will send her extracted stomach for  confirmatory testing. If positive we will treat postoperatively  All of her questions were asked and answered.  This patient encounter took 33 minutes today to perform the following: take history, perform exam, review outside records, interpret imaging, counsel the patient on their diagnosis and document encounter, findings & plan in the EHR  No follow-ups on file.  Leighton Ruff. Redmond Pulling MD FACS General, Minimally Invasive, & Bariatric Surgery Electronically signed by Rudean Curt, MD at 02/05/2022 4:22 PM EST

## 2022-02-23 NOTE — Transfer of Care (Signed)
Immediate Anesthesia Transfer of Care Note  Patient: Madeline Cooke  Procedure(s) Performed: LAPAROSCOPIC SLEEVE GASTRECTOMY AND HIATAL HERNIA REPAIR UPPER GI ENDOSCOPY  Patient Location: PACU  Anesthesia Type:General  Level of Consciousness: drowsy  Airway & Oxygen Therapy: Patient Spontanous Breathing and Patient connected to face mask oxygen  Post-op Assessment: Report given to RN and Post -op Vital signs reviewed and stable  Post vital signs: Reviewed and stable  Last Vitals:  Vitals Value Taken Time  BP 161/103 02/23/22 1225  Temp 36.8 C 02/23/22 1225  Pulse 88 02/23/22 1227  Resp 20 02/23/22 1227  SpO2 100 % 02/23/22 1227  Vitals shown include unvalidated device data.  Last Pain:  Vitals:   02/23/22 0850  TempSrc: Oral  PainSc:          Complications: No notable events documented.

## 2022-02-23 NOTE — Op Note (Signed)
Preoperative diagnosis: laparoscopic sleeve gastrectomy  Postoperative diagnosis: Same   Procedure: Upper endoscopy   Surgeon: Vada Swift A Kyrianna Barletta, M.D.  Anesthesia: Gen.   Description of procedure: The endoscope was placed in the mouth and oropharynx and under endoscopic vision it was advanced to the esophagogastric junction which was identified at 38cm from the teeth.  The pouch was tensely insufflated while the upper abdomen was flooded with irrigation to perform a leak test, which was negative. No bubbles were seen.  The staple line was hemostatic and the lumen was evenly tubular without undue narrowing, angulation or twisting specifically at the incisura angularis. The lumen was decompressed and the scope was withdrawn without difficulty.    Nickayla Mcinnis A Darsh Vandevoort, M.D. General, Bariatric, & Minimally Invasive Surgery [facility]   

## 2022-02-23 NOTE — Op Note (Signed)
02/23/2022 Skeet Simmer Badolato 1985-07-05 [id number]   PRE-OPERATIVE DIAGNOSIS:   Severe obesity (BMI 48)  Essential hypertension  Chronic midline low back pain without sciatica  Bilateral chronic knee pain  Vitamin D deficiency  Positive H. pylori test   POST-OPERATIVE DIAGNOSIS:  same  PROCEDURE:  Procedure(s): LAPAROSCOPIC SLEEVE GASTRECTOMY SLIDING WITH HIATAL HERNIA REPAIR UPPER GI ENDOSCOPY Laparoscopic bilateral TAP block  SURGEON:  Surgeon(s): Atilano Ina, MD FACS FASMBS  ASSISTANTS: Phylliss Blakes MD FACS  ANESTHESIA:   general  DRAINS: none   BOUGIE: 40 fr ViSiGi  LOCAL MEDICATIONS USED:   Exparel  EBL: minimal   SPECIMEN:  Source of Specimen:  Greater curvature of stomach  DISPOSITION OF SPECIMEN:  PATHOLOGY  COUNTS:  YES  INDICATION FOR PROCEDURE: This is a very pleasant 36 y.o.-year-old morbidly obese female who has had unsuccessful attempts for sustained weight loss. The patient presents today for a planned laparoscopic sleeve gastrectomy with upper endoscopy. We have discussed the risk and benefits of the procedure extensively preoperatively. Please see my separate notes.  Her preoperative H. pylori breath test was positive but she did not receive treatment.  The plan will be to send her gastric remnant for confirmatory testing  PROCEDURE: After obtaining informed consent and receiving 5000 units of subcutaneous heparin, the patient was brought to the operating room at [facility] and placed supine on the operating room table. General endotracheal anesthesia was established. Sequential compression devices were placed. A orogastric tube was placed. The patient's abdomen was prepped and draped in the usual standard surgical fashion. The patient received preoperative IV antibiotic. A surgical timeout was performed. ERAS protocol used.   Access to the abdomen was achieved using a 5 mm 0 laparoscope thru a 5 mm trocar In the left upper Quadrant  2 fingerbreadths below the left subcostal margin using the Optiview technique. Pneumoperitoneum was smoothly established up to 15 mm of mercury. The laparoscope was advanced and the abdominal cavity was surveilled. The patient was then placed in reverse Trendelenburg.   A 5 mm trocar was placed slightly above and to the left of the umbilicus under direct visualization.  The [facility] liver retractor was placed under the left lobe of the liver through a 5 mm trocar incision site in the subxiphoid position. A 5 mm trocar was placed in the lateral right upper quadrant along with a 15 mm trocar in the mid right abdomen. A final 5 mm trocar was placed in the lateral LUQ.  All under direct visualization after exparel had been infiltrated in bilateral lateral upper abdominal walls as a TAP block for postoperative pain relief.  The stomach was inspected. It was completely decompressed and the orogastric tube was removed.  There was no anterior dimple that was obviously visible.  Her preoperative upper GI suggested no sliding hiatal hernia.    We identified the pylorus and measured 6 cm proximal to the pylorus and identified an area of where we would start taking down the short gastric vessels. Harmonic scalpel was used to take down the short gastric vessels along the greater curvature of the stomach. We were able to enter the lesser sac. We continued to march along the greater curvature of the stomach taking down the short gastrics. As we approached the gastrosplenic ligament we took care in this area not to injure the spleen. We were able to take down the entire gastrosplenic ligament. We then mobilized the fundus away from the left crus of diaphragm.  There appeared  to be a plug of adipose tissue posteriorly going into the hiatus suggesting a small sliding hiatal hernia.There were not any significant posterior gastric avascular attachments. This left the stomach completely mobilized. No vessels had been taken down  along the lesser curvature of the stomach.    In order to confirm whether or not there was a sliding hiatal hernia, The gastrohepatic ligament was incised with harmonic scalpel. The right crus was identified.  Patient had an enlarged caudate lobe.  We identified the crossing fat along the right crus. The adipose tissue just above this area was incised with harmonic scalpel. I then bluntly dissected out this area and identified the left crus. There was evidence of a small sliding hiatal hernia. I then mobilized the esophagus. The left and right crus were further mobilized with blunt dissection. I was then able to reapproximate the left and right crus with 0 Ethibond using an Endostitch suture device and securing it with a titanium tyknot. .   We then reidentified the pylorus. A 40Fr ViSiGi was then placed in the oropharynx and advanced down into the stomach and placed in the distal antrum and positioned along the lesser curvature. It was placed under suction which secured the 40Fr ViSiGi in place along the lesser curve. Then using the Ethicon echelon 60 mm stapler with a gold load with ethicon staple line reinforcement (ESLR), I placed a stapler along the antrum approximately 5 cm from the pylorus. The stapler was angled so that there is ample room at the angularis incisura. I then fired the first staple load after inspecting it posteriorly to ensure adequate space both anteriorly and posteriorly. At this point I still was not completely past the angularis so with a blue load with ESLR, I placed the stapler in position just inside the prior stapleline. We then rotated the stomach to insure that there was adequate anteriorly as well as posteriorly. The stapler was then fired.  At this point I started using 60 mm blue load staple cartridges with ESLR. The echelon stapler was then repositioned with a 60 mm blue load with ESLR and we continued to march up along the ViSiGi. My assistant was holding traction along  the greater curvature stomach along the cauterized short gastric vessels ensuring that the stomach was symmetrically retracted. Prior to each firing of the staple, we rotated the stomach to ensure that there is adequate stomach left.  As we approached the fundus, I used 60 mm blue cartridge with ESLR aiming  lateral to the GE junction after mobilizing some of the esophageal fat pad.  There was a little bit of bleeding from the undersurface of the left lobe of the liver. hemoStasis was achieved with  electrocautery.  The sleeve was inspected. There is no evidence of cork screw. The staple line appeared hemostatic. The CRNA inflated the ViSiGi to the green zone and the upper abdomen was flooded with saline. There were no bubbles. The sleeve was decompressed and the ViSiGi removed.  Pneumoperitoneum was reduced to 8 mmHg so we can inspect for bleeding along the staple line.  My assistant scrubbed out and performed an upper endoscopy. The sleeve easily distended with air and the scope was easily advanced to the pylorus. There is no evidence of internal bleeding or cork screwing. There was no narrowing at the angularis. There is no evidence of bubbles. Please see her operative note for further details. The gastric sleeve was decompressed and the endoscope was removed.  I reinspected the undersurface  of the left lobe of the liver.  It was dry.  But I decided to leave a piece of surgical snow since the patient would be receiving chemical VTE prophylaxis postoperatively.  The greater curvature the stomach was grasped with a laparoscopic grasper and removed from the 15 mm trocar site.  The liver retractor was removed. I then closed the 15 mm trocar site with 1 interrupted 0 Vicryl sutures through the fascia using the endoclose. The closure was viewed laparoscopically and it was airtight. Remaining Exparel was then infiltrated in the preperitoneal spaces around the trocar sites. Pneumoperitoneum was released. All trocar sites  were closed with a 4-0 Monocryl in a subcuticular fashion followed by the application of steri-strips, and bandaids. The patient was extubated and taken to the recovery room in stable condition. All needle, instrument, and sponge counts were correct x2. There are no immediate complications  (1) 60 mm gold with ESLR (5) 60 mm blue with ESLR  PLAN OF CARE: Admit to inpatient   PATIENT DISPOSITION:  PACU - hemodynamically stable.   Delay start of Pharmacological VTE agent (>24hrs) due to surgical blood loss or risk of bleeding:  no  Mary Sella. Andrey Campanile, MD, FACS FASMBS General, Bariatric, & Minimally Invasive Surgery [facility] Surgery, [place]

## 2022-02-24 ENCOUNTER — Encounter (HOSPITAL_COMMUNITY): Payer: Self-pay | Admitting: General Surgery

## 2022-02-24 LAB — CBC WITH DIFFERENTIAL/PLATELET
Abs Immature Granulocytes: 0.04 10*3/uL (ref 0.00–0.07)
Basophils Absolute: 0 10*3/uL (ref 0.0–0.1)
Basophils Relative: 0 %
Eosinophils Absolute: 0 10*3/uL (ref 0.0–0.5)
Eosinophils Relative: 0 %
HCT: 37.7 % (ref 36.0–46.0)
Hemoglobin: 12.8 g/dL (ref 12.0–15.0)
Immature Granulocytes: 1 %
Lymphocytes Relative: 6 %
Lymphs Abs: 0.6 10*3/uL — ABNORMAL LOW (ref 0.7–4.0)
MCH: 27.7 pg (ref 26.0–34.0)
MCHC: 34 g/dL (ref 30.0–36.0)
MCV: 81.6 fL (ref 80.0–100.0)
Monocytes Absolute: 0.6 10*3/uL (ref 0.1–1.0)
Monocytes Relative: 7 %
Neutro Abs: 7.4 10*3/uL (ref 1.7–7.7)
Neutrophils Relative %: 86 %
Platelets: 212 10*3/uL (ref 150–400)
RBC: 4.62 MIL/uL (ref 3.87–5.11)
RDW: 13.7 % (ref 11.5–15.5)
WBC: 8.6 10*3/uL (ref 4.0–10.5)
nRBC: 0 % (ref 0.0–0.2)

## 2022-02-24 LAB — COMPREHENSIVE METABOLIC PANEL
ALT: 39 U/L (ref 0–44)
AST: 28 U/L (ref 15–41)
Albumin: 3.6 g/dL (ref 3.5–5.0)
Alkaline Phosphatase: 61 U/L (ref 38–126)
Anion gap: 7 (ref 5–15)
BUN: 10 mg/dL (ref 6–20)
CO2: 23 mmol/L (ref 22–32)
Calcium: 8.7 mg/dL — ABNORMAL LOW (ref 8.9–10.3)
Chloride: 107 mmol/L (ref 98–111)
Creatinine, Ser: 0.8 mg/dL (ref 0.44–1.00)
GFR, Estimated: >60 mL/min (ref >60)
Glucose, Bld: 144 mg/dL — ABNORMAL HIGH (ref 70–99)
Potassium: 4.1 mmol/L (ref 3.5–5.1)
Sodium: 137 mmol/L (ref 135–145)
Total Bilirubin: 0.6 mg/dL (ref 0.3–1.2)
Total Protein: 7.4 g/dL (ref 6.5–8.1)

## 2022-02-24 MED ORDER — ACETAMINOPHEN 500 MG PO TABS: 1000.0000 mg | ORAL_TABLET | Freq: Three times a day (TID) | ORAL | 0 refills | Status: AC

## 2022-02-24 MED ORDER — ONDANSETRON 4 MG PO TBDP: 4.0000 mg | ORAL_TABLET | Freq: Four times a day (QID) | ORAL | 0 refills | Status: DC | PRN

## 2022-02-24 MED ORDER — OXYCODONE HCL 5 MG PO TABS: 5.0000 mg | ORAL_TABLET | Freq: Four times a day (QID) | ORAL | 0 refills | Status: DC | PRN

## 2022-02-24 MED ORDER — PANTOPRAZOLE SODIUM 40 MG PO TBEC: 40.0000 mg | DELAYED_RELEASE_TABLET | Freq: Every day | ORAL | 0 refills | Status: DC

## 2022-02-24 NOTE — TOC CM/SW Note (Signed)
Transition of Care [facility]) Screening Note  Patient Details  Name: Madeline Cooke Date of Birth: 04-May-1985  Transition of Care [facility]) CM/SW Contact:    Ewing Schlein, LCSW Phone Number: 02/24/2022, 10:11 AM  Transition of Care Department [facility]) has reviewed patient and no TOC needs have been identified at this time. We will continue to monitor patient advancement through interdisciplinary progression rounds. If new patient transition needs arise, please place a TOC consult.

## 2022-02-24 NOTE — Discharge Summary (Signed)
Physician Discharge Summary  Madeline Cooke IYM:[id number] DOB: 09/05/1985 DOA: 02/23/2022  PCP: Malachy Moan, MD  Admit date: 02/23/2022 Discharge date: 02/24/2022  Recommendations for Outpatient Follow-up:    Follow-up Information     [name], MD. Go on 03/25/2022.   Specialty: General Surgery Why: at 10:15am. Please arrive 15 minutes prior to your appointment time. Thank you. Contact information: [address] [place] [address] [phone]         [name], [name], [place]. Go on 04/21/2022.   Specialty: General Surgery Why: at 2:30pm for Dr. Redmond Pulling. Please arrive 15 minutes prior to your appointment time. Thank you. Contact information: [place] [address] [phone]                Discharge Diagnoses:  Principal Problem:   S/P laparoscopic sleeve gastrectomy Severe obesity (BMI 48)  Essential hypertension  Chronic midline low back pain without sciatica  Bilateral chronic knee pain  Vitamin D deficiency  Positive H. pylori test   Surgical Procedure: Laparoscopic Sleeve Gastrectomy with sliding hiatal hernia repair, upper endoscopy  Discharge Condition: Good Disposition: Home  Diet recommendation: Postoperative sleeve gastrectomy diet (liquids only)  Filed Weights   02/23/22 0850 02/23/22 1410  Weight: 123.7 kg 123.7 kg     Hospital Course:  The patient was admitted for a planned laparoscopic sleeve gastrectomy. Please see operative note. Preoperatively the patient was given 5000 units of subcutaneous heparin for DVT prophylaxis. Postoperative prophylactic heparin dosing was started on the evening of postoperative day 0. ERAS protocol was used. On the evening of postoperative day 0, the patient was started on water and ice chips. On postoperative day 1 the patient had no fever or tachycardia and was tolerating water in their diet was gradually advanced  throughout the day. The patient was ambulating without difficulty. Their vital signs are stable without fever or tachycardia. Their hemoglobin had remained stable.  The patient had received discharge instructions and counseling. They were deemed stable for discharge and had met discharge criteria  BP (!) 158/111 (BP Location: Right Arm)   Pulse 91   Temp 98.8 F (37.1 C) (Oral)   Resp 16   Ht _0  (1.6 m)   Wt 123.7 kg   LMP  (LMP Unknown)   SpO2 100%   BMI 48.29 kg/m   Gen: alert, NAD, non-toxic appearing Pupils: equal, no scleral icterus Pulm: Lungs clear to auscultation, symmetric chest rise CV: regular rate and rhythm Abd: soft, mild approp tender, nondistended.  No cellulitis. No incisional hernia Ext: no edema, no calf tenderness Skin: no rash, no jaundice  Discharge Instructions  Discharge Instructions     Ambulate hourly while awake   Complete by: As directed    Call MD for:  difficulty breathing, headache or visual disturbances   Complete by: As directed    Call MD for:  persistant dizziness or light-headedness   Complete by: As directed    Call MD for:  persistant nausea and vomiting   Complete by: As directed    Call MD for:  redness, tenderness, or signs of infection (pain, swelling, redness, odor or green/yellow discharge around incision site)   Complete by: As directed    Call MD for:  severe uncontrolled pain   Complete by: As directed    Call MD for:  temperature >101 F   Complete by: As directed    Diet bariatric full liquid  Complete by: As directed    Discharge instructions   Complete by: As directed    See bariatric discharge instructions   Incentive spirometry   Complete by: As directed    Perform hourly while awake      Allergies as of 02/24/2022   No Known Allergies      Medication List     STOP taking these medications    diclofenac 75 MG EC tablet Commonly known as: VOLTAREN   Midol Complete 500-60-15 MG Tabs Generic drug:  Acetaminophen-Caff-Pyrilamine       TAKE these medications    acetaminophen 500 MG tablet Commonly known as: TYLENOL Take 2 tablets (1,000 mg total) by mouth every 8 (eight) hours for 5 days.   amLODipine 10 MG tablet Commonly known as: NORVASC Take 10 mg by mouth daily. Notes to patient: Monitor Blood Pressure Daily and keep a log for primary care physician.  You may need to make changes to your medications with rapid weight loss.      Biofreeze 4 % Gel Generic drug: Menthol (Topical Analgesic) Apply 1 Application topically at bedtime.   Co Q 10 100 MG Caps Take 100 mg by mouth daily.   ergocalciferol 1.25 MG (50000 UT) capsule Commonly known as: VITAMIN D2 Take 50,000 Units by mouth once a week.   Flax Seed Oil 1000 MG Caps Take 1,000 mg by mouth daily.   losartan-hydrochlorothiazide 100-25 MG tablet Commonly known as: HYZAAR Take 1 tablet by mouth daily. Notes to patient: Monitor Blood Pressure Daily and keep a log for primary care physician.  You may need to make changes to your medications with rapid weight loss.      ondansetron 4 MG disintegrating tablet Commonly known as: ZOFRAN-ODT Take 1 tablet (4 mg total) by mouth every 6 (six) hours as needed for nausea or vomiting.   oxyCODONE 5 MG immediate release tablet Commonly known as: Oxy IR/ROXICODONE Take 1 tablet (5 mg total) by mouth every 6 (six) hours as needed for severe pain.   pantoprazole 40 MG tablet Commonly known as: PROTONIX Take 1 tablet (40 mg total) by mouth daily.   spironolactone 25 MG tablet Commonly known as: ALDACTONE Take 25 mg by mouth daily. Notes to patient: Monitor Blood Pressure Daily and keep a log for primary care physician.  Monitor for symptoms of dehydration.  You may need to make changes to your medications with rapid weight loss.      THERAFLU FLU/COLD PO Take 1 Package by mouth daily as needed (Cold). Severe with 8 oz of water   Vitamin D-3 125 MCG (5000 UT) Tabs Take  5,000 Units by mouth daily.        Follow-up Information     [name], MD. Go on 03/25/2022.   Specialty: General Surgery Why: at 10:15am. Please arrive 15 minutes prior to your appointment time. Thank you. Contact information: [address] [place] [address] [phone]         [name], [name], [place]. Go on 04/21/2022.   Specialty: General Surgery Why: at 2:30pm for Dr. Redmond Pulling. Please arrive 15 minutes prior to your appointment time. Thank you. Contact information: [place] [address] [phone]                  The results of significant diagnostics from this hospitalization (including imaging, microbiology, ancillary and laboratory) are listed below for reference.    Significant Diagnostic Studies:  No results found.  Labs: Basic Metabolic Panel: Recent Labs  Lab 02/23/22 1537 02/24/22 0458  NA  --  137  K  --  4.1  CL  --  107  CO2  --  23  GLUCOSE  --  144*  BUN  --  10  CREATININE 0.93 0.80  CALCIUM  --  8.7*   Liver Function Tests: Recent Labs  Lab 02/24/22 0458  AST 28  ALT 39  ALKPHOS 61  BILITOT 0.6  PROT 7.4  ALBUMIN 3.6    CBC: Recent Labs  Lab 02/23/22 1537 02/23/22 1538 02/24/22 0458  WBC 12.9*  --  8.6  NEUTROABS  --   --  7.4  HGB 13.1 13.0 12.8  HCT 39.5 38.8 37.7  MCV 82.1  --  81.6  PLT 208  --  212    CBG: No results for input(s): "GLUCAP" in the last 168 hours.  Principal Problem:   S/P laparoscopic sleeve gastrectomy   Time coordinating discharge: 20 min  Signed:  Gayland Curry, MD [facility], [place] [phone] 02/24/2022, 3:53 PM

## 2022-02-24 NOTE — Progress Notes (Signed)
Patient alert and oriented, pain is controlled. Patient is tolerating fluids, advanced to protein shake today, patient is tolerating well.  Reviewed Gastric sleeve discharge instructions with patient and patient is able to articulate understanding.  Provided information on BELT program, Support Group and [facility] outpatient pharmacy. All questions answered, will continue to monitor.  

## 2022-02-24 NOTE — Progress Notes (Signed)
Patient was given discharged, and all questions were answered. Patient was stable for discharge and was taken to the main exit by wheelchair.

## 2022-02-27 ENCOUNTER — Telehealth (HOSPITAL_COMMUNITY): Payer: Self-pay | Admitting: *Deleted

## 2022-02-27 LAB — SURGICAL PATHOLOGY

## 2022-02-27 NOTE — Telephone Encounter (Addendum)
1.  Tell me about your pain and pain management? Pt denies any pain, just states that she is sore. Pt c/o right lower abdominal incisional pain with movement and exertion.  Pt states that she has been taking pain medicine as needed. Discussed with patient to try and splint her abdomen with changing positions to assist with the discomfort.  Pt states that she has been alternating between a heating pad and an ice pack. Encouraged pt to try options and/or contact [facility] if still concerned.   2.  Let's talk about fluid intake.  How much total fluid are you taking in? Pt states that she is getting in at least 64oz of fluid including protein shakes, bottled water, cream soups, popsicles, and jello.  Pt instructed to assess status and suggestions daily utilizing Hydration Action Plan on discharge folder and to call [facility] if in the "red zone".   3.  How much protein have you taken in the last 2 days? Pt states she is meeting her goal of 60g of protein each day with the protein shakes.  4.  Have you had nausea?  Tell me about when have experienced nausea and what you did to help? Pt denies nausea.   5.  Has the frequency or color changed with your urine? Pt states that she is urinating "fine" with no changes in frequency or urgency.     6.  Tell me what your incisions look like? "Incisions look fine, they are just itching". Pt denies a fever, chills.  Pt states incisions are not swollen, open, or draining.  Pt encouraged to call [facility] if incisions change.   7.  Have you been passing gas? BM? Pt states that she is having BMs. Last BM 02/25/22.     8.  If a problem or question were to arise who would you call?  Do you know contact numbers for [facility], [facility], and [facility]? Pt denies dehydration symptoms.  Pt can describe s/sx of dehydration.  Pt knows to call [facility] for surgical, [facility] for nutrition, and [facility] for non-urgent questions or concerns.   9.  How has the walking going? Pt states she is walking around and able to be  active without difficulty.   10. Are you still using your incentive spirometer?  If so, how often? Pt states that she is doing the I.S.   Pt encouraged to use incentive spirometer, at least 10x every hour while awake until she sees the surgeon.  11.  How are your vitamins and calcium going?  How are you taking them? Pt states that she will begin the supplements tomorrow.  Reinforced education about taking supplements at least two hours apart.  Reminded patient that the first 30 days post-operatively are important for successful recovery.  Practice good hand hygiene, wearing a mask when appropriate (since optional in most places), and minimizing exposure to people who live outside of the home, especially if they are exhibiting any respiratory, GI, or illness-like symptoms.

## 2022-03-02 ENCOUNTER — Telehealth: Payer: Self-pay | Admitting: Dietician

## 2022-03-02 NOTE — Telephone Encounter (Signed)
Pt called with questions about current food phase of full liquid diet, and clarification of upcoming food phase. Questions were answered to the patient's satisfaction.

## 2022-03-10 ENCOUNTER — Encounter: Payer: Self-pay | Admitting: Dietician

## 2022-03-10 ENCOUNTER — Encounter: Payer: 59 | Attending: General Surgery | Admitting: Dietician

## 2022-03-10 VITALS — Ht 63.0 in | Wt 254.1 lb

## 2022-03-10 DIAGNOSIS — E669 Obesity, unspecified: Secondary | ICD-10-CM | POA: Diagnosis present

## 2022-03-10 NOTE — Progress Notes (Signed)
2 Week Post-Operative Nutrition Class   Patient was seen on 03/10/2022 for Post-Operative Nutrition education at the [facility].    Surgery date: 02/23/2022 Surgery type: Sleeve Gastrectomy  Anthropometrics  Start weight at [facility]: 275.8 lbs (date: 01/08/2022)  Height: 63 in Weight today: 254.1 lb BMI: 45.01 kg/m2     Clinical  Medical hx: HTN, anemia obesity Medications: Amlodipine, losartan HTZ   Labs: Vit D; 16.1; glucose 113; potasium 3.3 Notable signs/symptoms: none noted Any previous deficiencies? No Bowel Habits: Every day to every other day no complaints   Body Composition Scale 03/10/2022  Current Body Weight 254.1  Total Body Fat % 45.6  Visceral Fat 15  Fat-Free Mass % 54.3   Total Body Water % 41.6  Muscle-Mass lbs 31.9  BMI 44.6  Body Fat Displacement          Torso  lbs 71.9         Left Leg  lbs 14.3         Right Leg  lbs 14.3         Left Arm  lbs 7.1         Right Arm   lbs 7.1      The following the learning objectives were met by the patient during this course: Identifies Phase 3 (Soft, High Proteins) Dietary Goals and will begin from 2 weeks post-operatively to 2 months post-operatively Identifies appropriate sources of fluids and proteins  Identifies appropriate fat sources and healthy verses unhealthy fat types   States protein recommendations and appropriate sources post-operatively Identifies the need for appropriate texture modifications, mastication, and bite sizes when consuming solids Identifies appropriate fat consumption and sources Identifies appropriate multivitamin and calcium sources post-operatively Describes the need for physical activity post-operatively and will follow MD recommendations States when to call healthcare provider regarding medication questions or post-operative complications   Handouts given during class include: Phase 3A: Soft, High Protein Diet Handout Phase 3 High Protein  Meals Healthy Fats   Follow-Up Plan: Patient will follow-up at [facility] in 6 weeks for 2 month post-op nutrition visit for diet advancement per MD.

## 2022-03-17 ENCOUNTER — Telehealth: Payer: Self-pay | Admitting: Dietician

## 2022-03-17 NOTE — Telephone Encounter (Signed)
RD called pt to verify fluid intake once starting soft, solid proteins 2 week post-bariatric surgery.   Daily Fluid intake: 48 oz Daily Protein intake: 60 grams Bowel Habits: constipated sometimes, taking Murelax   Concerns/issues:  states she went back to fluids when she was not having bowel movements. Advised pt to keep eating as long as she is tolerating the solid protein foods, take Murelax and supplement with protein shakes as needed.

## 2022-04-22 ENCOUNTER — Encounter: Payer: Self-pay | Admitting: Skilled Nursing Facility1

## 2022-04-22 ENCOUNTER — Encounter: Payer: 59 | Attending: General Surgery | Admitting: Skilled Nursing Facility1

## 2022-04-22 VITALS — Ht 63.0 in | Wt 249.1 lb

## 2022-04-22 DIAGNOSIS — E669 Obesity, unspecified: Secondary | ICD-10-CM

## 2022-04-22 NOTE — Progress Notes (Signed)
Bariatric Nutrition Follow-Up Visit Medical Nutrition Therapy  Surgery date: 02/23/2022 Surgery type: Sleeve Gastrectomy  Anthropometrics  Start weight at [facility]: 275.8 lbs (date: 01/08/2022)  Height: 63 in Weight today: 249.1 lb   Clinical  Medical hx: HTN, anemia obesity Medications: Amlodipine, losartan HTZ   Labs: Vit D; 16.1; glucose 113; potasium 3.3 Notable signs/symptoms: none noted Any previous deficiencies? No Bowel Habits: Every day to every other day no complaints   Body Composition Scale 03/10/2022 04/22/2022  Current Body Weight 254.1 249.1  Total Body Fat % 45.6 45.1  Visceral Fat 15 15  Fat-Free Mass % 54.3 54.8   Total Body Water % 41.6 41.9  Muscle-Mass lbs 31.9 31.8  BMI 44.6 43.7  Body Fat Displacement           Torso  lbs 71.9 69.7         Left Leg  lbs 14.3 13.9         Right Leg  lbs 14.3 13.9         Left Arm  lbs 7.1 6.9         Right Arm   lbs 7.1 6.9     Lifestyle & Dietary Hx  Pt states her PCP wanted to take her off the blood pressure medicine but pt wanted to stay on it longer until she lost more weight.    Estimated daily fluid intake: 64 oz Estimated daily protein intake: 60 g Supplements: multi and not taking calcium  Current average weekly physical activity: walking 3 days a week 20-30 minutes, some stepper   24-Hr Dietary Recall First Meal 7:30-9: protein shake and coffee Snack:   Second Meal 12:30-1: boiled egg Snack:   Third Meal: chicken  Snack:  Beverages: water, vitamin water, water + flavoring, half and half sweet tea  Post-Op Goals/ Signs/ Symptoms Using straws: no Drinking while eating: no Chewing/swallowing difficulties: no Changes in vision: no Changes to mood/headaches: no Hair loss/changes to skin/nails: no Difficulty focusing/concentrating: no Sweating: no Limb weakness: no Dizziness/lightheadedness: no Palpitations: no  Carbonated/caffeinated beverages: no N/V/D/C/Gas: taking mirilax  Abdominal pain:  no Dumping syndrome: with panera bread autumn squash soup     NUTRITION DIAGNOSIS  Overweight/obesity ([place]-3.3) related to past poor dietary habits and physical inactivity as evidenced by completed bariatric surgery and following dietary guidelines for continued weight loss and healthy nutrition status.     NUTRITION INTERVENTION Nutrition counseling (C-1) and education (E-2) to facilitate bariatric surgery goals, including:  The importance of consuming adequate calories as well as certain nutrients daily due to the body's need for essential vitamins, minerals, and fats The importance of daily physical activity and to reach a goal of at least 150 minutes of moderate to vigorous physical activity weekly (or as directed by their physician) due to benefits such as increased musculature and improved lab values The importance of intuitive eating specifically learning hunger-satiety cues and understanding the importance of learning a new body: The importance of mindful eating to avoid grazing behaviors  Importance of vegetables To have an overall healthy diet, adult men and women are recommended to consume anywhere from 2-3 cups of vegetables daily. Vegetables provide a wide range of vitamins and minerals such as vitamin A, vitamin C, potassium, and folic acid. According to the [employer], including fruit and vegetables daily may reduce the risk of cardiovascular disease, certain cancers, and other non-communicable diseases. Why physical activity is needed:  Physical activity has various benefits for the body such as  reducing stress, helping control blood glucose levels, strengthening the heart, reducing incidence of cognitive impairment, and has a neuroprotective effect on memory function. Research reports that physical activity (whether it be aerobic, resistance training, or high intensity interval training) help control the level of reactive oxygen species which can lead to oxidative  stress on the brain therefor possibly impairing memory, learning abilities, and other cognitive functions. The CDC recognizes that physical activity can help prevent premature death, may prevent the development of type 2 diabetes, different cancers, and heart disease. Not only are there are health benefits to physical activity, the CDC also reports that physical activity would decrease health care costs, increase property values, and people who are physically active generally have less sick days. Resource https://www-sciencedirect-com.libproxy.http://www.castillo-fisher.org/, CDC  Handouts Provided Include  handout  Learning Style & Readiness for Change Teaching method utilized: Visual & Auditory  Demonstrated degree of understanding via: Teach Back  Readiness Level: contemplative  Barriers to learning/adherence to lifestyle change: unidentified   RD's Notes for Next Visit Assess adherence to pt chosen goals    MONITORING & EVALUATION Dietary intake, weekly physical activity, body weight  Next Steps Patient is to follow-up in 3 months

## 2022-05-14 IMAGING — US US BREAST*L* LIMITED INC AXILLA
1 series · 10 of 10 positions shown · non-contrast
Comparison: Previous exam(s).

CLINICAL DATA: 34-year-old female for delayed 12 month follow-up of
INNER LEFT breast mass (now 18 months).

EXAM:
ULTRASOUND OF THE LEFT BREAST

[Series 1: us breast*left* limited inc axilla · 0.07mm/px · 10 of 10 slices shown]
[im 1/10]
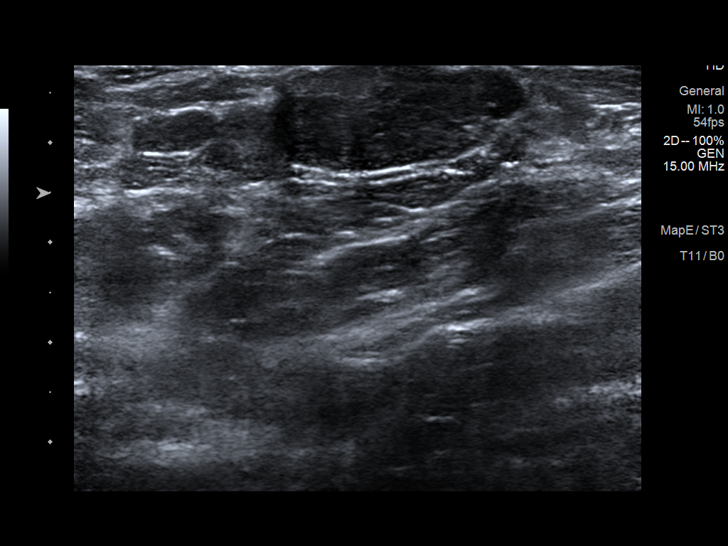
[im 2/10]
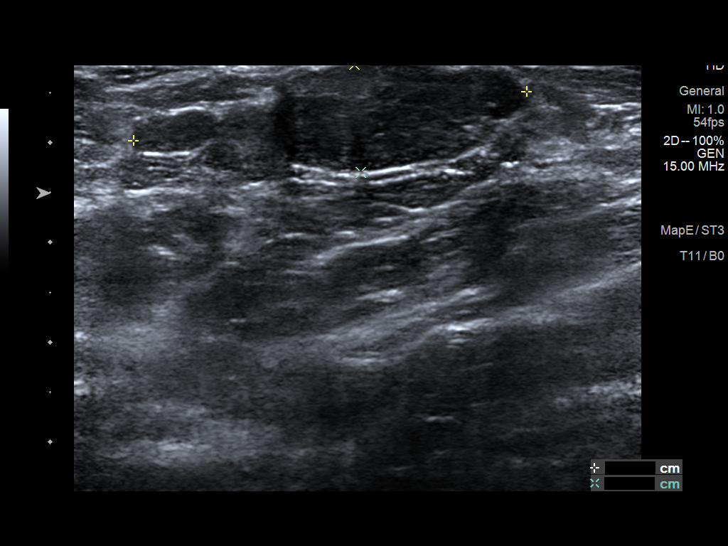
[im 3/10]
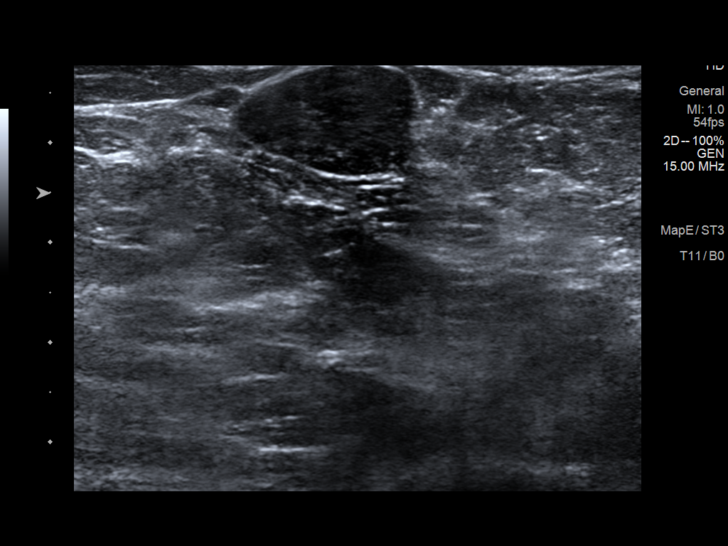
[im 4/10]
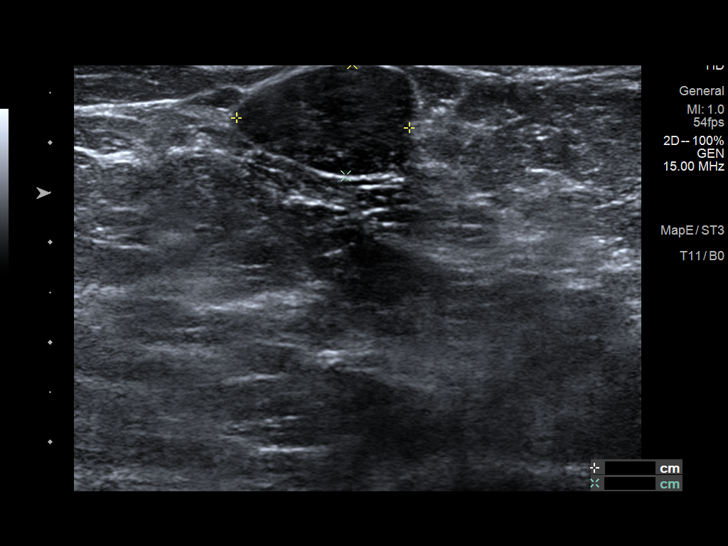
[im 5/10]
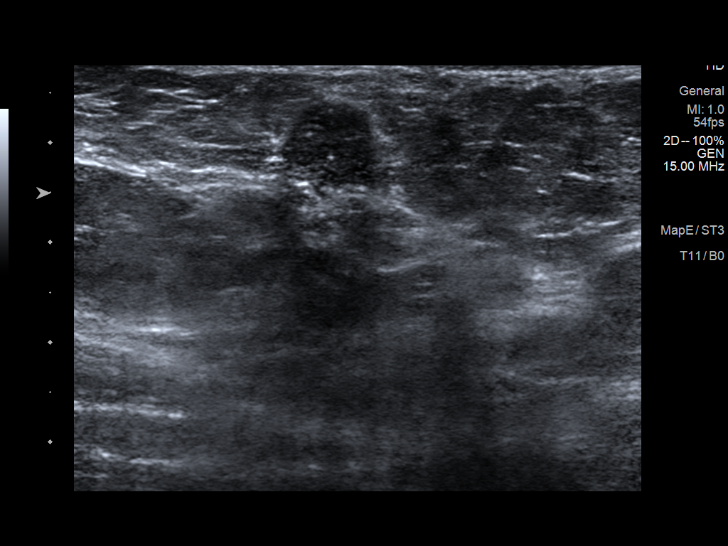
[im 6/10]
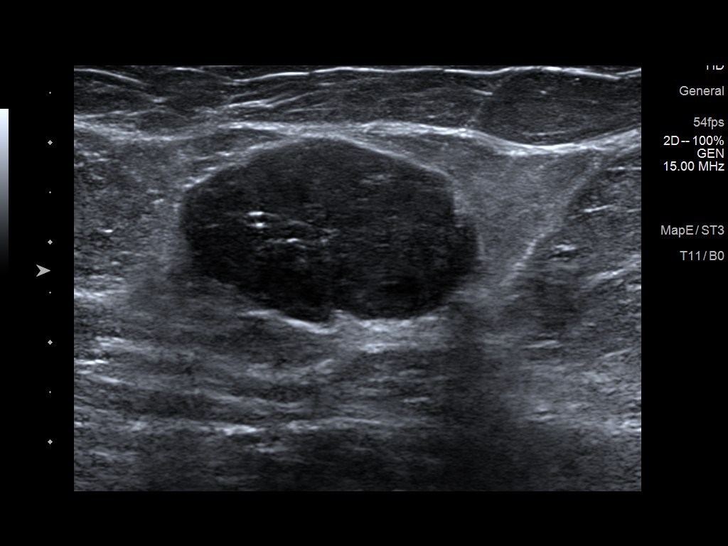
[im 7/10]
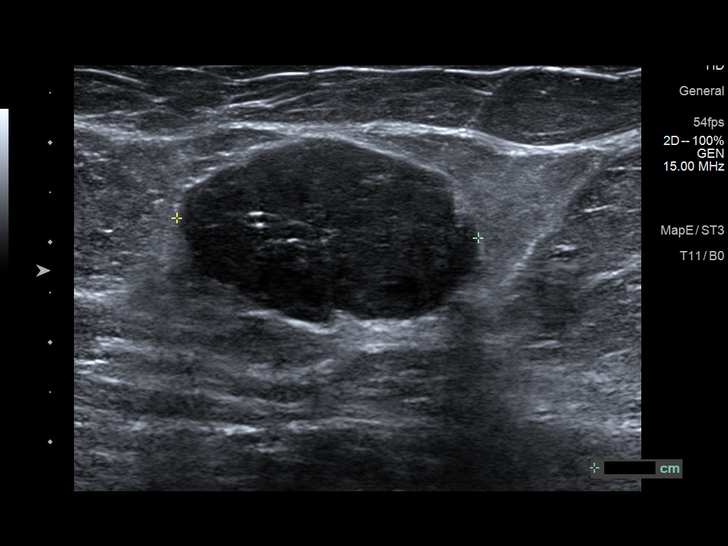
[im 8/10]
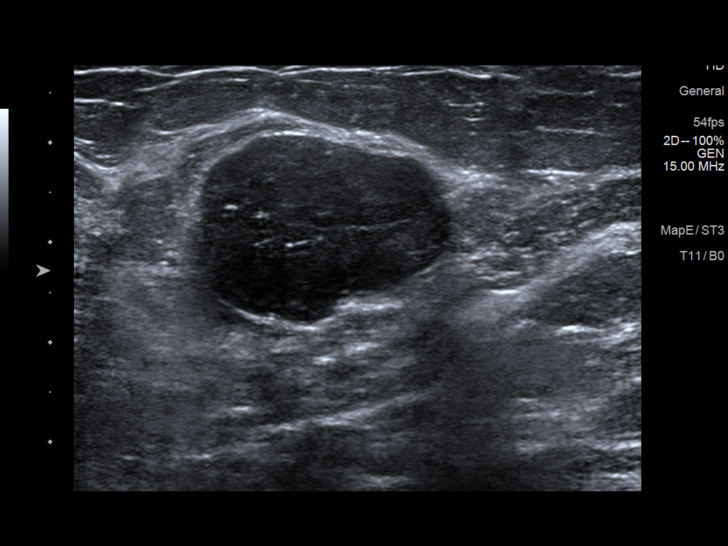
[im 9/10]
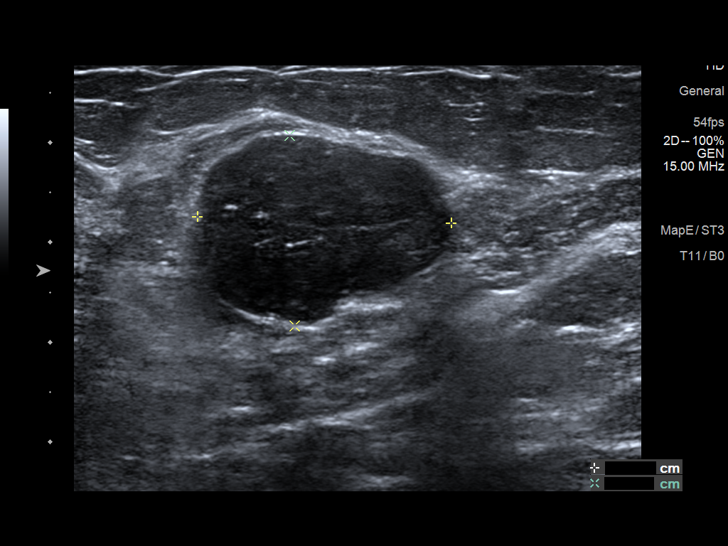
[im 10/10]
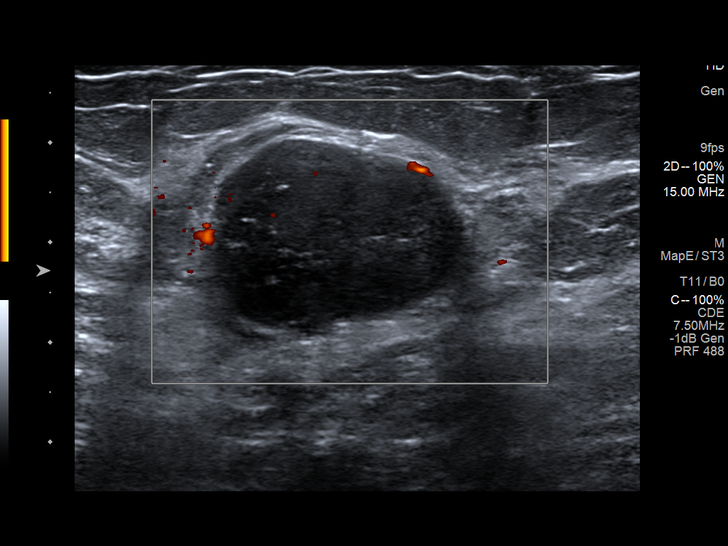

[10 of 10 positions shown; findings below may reference images not displayed]

FINDINGS: Targeted ultrasound of the LEFT breast is performed, showing a 2.4 x
1.9 x 3 cm circumscribed oval hypoechoic parallel mass at the [DATE]
position 5 cm from the nipple, unchanged from 08/24/2018.

A 4 x 1.7 x 1.3 cm circumscribed oval hypoechoic parallel mass at
the 7 o'clock position 1 cm from the nipple is unchanged from 8058
and compatible with a benign fibroadenoma.
IMPRESSION: 1. Stable 3 cm mass at the [DATE] position of the LEFT breast,
unchanged for 18 months. One additional ultrasound follow-up in 6
months is recommended to ensure 2 year stability.
2. Stable benign fibroadenoma at the 7 o'clock position of the LEFT
breast since 8058. No further imaging follow-up recommended.

RECOMMENDATION:
LEFT breast ultrasound in 6 months to follow-up likely benign mass
at the [DATE] position.

I have discussed the findings and recommendations with the patient.
If applicable, a reminder letter will be sent to the patient
regarding the next appointment.

BI-RADS CATEGORY  3: Probably benign.

## 2022-07-13 ENCOUNTER — Ambulatory Visit: Payer: Self-pay | Admitting: Surgery

## 2022-07-13 ENCOUNTER — Other Ambulatory Visit: Payer: Self-pay | Admitting: Surgery

## 2022-07-13 DIAGNOSIS — D242 Benign neoplasm of left breast: Secondary | ICD-10-CM

## 2022-07-13 DIAGNOSIS — D241 Benign neoplasm of right breast: Secondary | ICD-10-CM

## 2022-07-13 NOTE — H&P (Signed)
Subjective    Chief Complaint: New Consultation       History of Present Illness: Madeline Cooke is a 37 y.o. female who is seen today as an office consultation at the request of Dr. Cherly Hensen for evaluation of New Consultation .     This is a 36 year old female status post laparoscopic gastric sleeve by Dr. Andrey Campanile in 2023.  Over the last several years, the patient has been followed with occasional breast ultrasounds for palpable masses in the left breast.  Her last ultrasound was 02/05/2021.  At that time, she was noted to have a mass located at 930 in the left breast 5 cm from the nipple measuring 3.0 x 2.4 x 1.9 cm.  She has another left breast mass located at 7:00, 1 cm from the nipple measuring 4.0 x 1.7 x 1.3 cm.  At that time, she was referred for surgical evaluation but chose to not keep that appointment.  She feels that the mass located at 930 has become larger and is causing some discomfort.  She has not had any further imaging.  She denies palpating any masses in the right breast.  No family history of breast cancer.  No nipple discharge.   Review of Systems: A complete review of systems was obtained from the patient.  I have reviewed this information and discussed as appropriate with the patient.  See HPI as well for other ROS.   Review of Systems  Constitutional: Negative.   HENT: Negative.    Eyes: Negative.   Respiratory: Negative.    Cardiovascular: Negative.   Gastrointestinal: Negative.   Genitourinary: Negative.   Musculoskeletal: Negative.   Skin: Negative.   Neurological: Negative.   Endo/Heme/Allergies: Negative.   Psychiatric/Behavioral: Negative.          Medical History: Past Medical History      Past Medical History:  Diagnosis Date   Anemia     Diabetes mellitus without complication (CMS/HHS-HCC)     Hypertension          Problem List     Patient Active Problem List  Diagnosis   Essential hypertension        Past Surgical History        Past Surgical History:  Procedure Laterality Date   LAPAROSCOPIC SLEEVE GASTRECTOMY   02/23/2022    Dr Andrey Campanile   REPAIR HIATAL HERNIA   02/23/2022    Dr Andrey Campanile   mole removed             Allergies  No Known Allergies     Medications Ordered Prior to Encounter        Current Outpatient Medications on File Prior to Visit  Medication Sig Dispense Refill   amLODIPine (NORVASC) 10 MG tablet Take 1 tablet by mouth once daily       hydrALAZINE (APRESOLINE) 10 MG tablet Take 10 mg by mouth 3 (three) times daily       iron polysaccharides (FERREX) 150 mg iron capsule Take 150 mg by mouth once daily       losartan-hydroCHLOROthiazide (HYZAAR) 100-12.5 mg tablet         omeprazole (PRILOSEC) 40 MG DR capsule Take 1 capsule (40 mg total) by mouth once daily 90 capsule 0   pantoprazole (PROTONIX) 40 MG DR tablet Take 1 tablet (40 mg total) by mouth once daily 90 tablet 3   spironolactone (ALDACTONE) 50 MG tablet Take 50 mg by mouth once daily       valsartan-hydroCHLOROthiazide (DIOVAN-HCT) 320-25  mg tablet Take 1 tablet by mouth once daily       ergocalciferol, vitamin D2, 1,250 mcg (50,000 unit) capsule Take 1 capsule (50,000 Units total) by mouth once a week for 30 days 4 capsule 0    No current facility-administered medications on file prior to visit.        Family History  History reviewed. No pertinent family history.      Tobacco Use History  Social History       Tobacco Use  Smoking Status Never  Smokeless Tobacco Never        Social History  Social History        Socioeconomic History   Marital status: Married  Tobacco Use   Smoking status: Never   Smokeless tobacco: Never  Substance and Sexual Activity   Alcohol use: Not Currently   Drug use: Never    Social Determinants of Health        Financial Resource Strain: Low Risk  (01/12/2022)    Received from [facility], [facility]    Overall Financial Resource Strain (CARDIA)     Difficulty of Paying  Living Expenses: Not hard at all  Food Insecurity: Low Risk  (06/10/2022)    Received from [facility]    Food vital sign     Within the past 12 months, you worried that your food would run out before you got money to buy more: Never true     Within the past 12 months, the food you bought just didn't last and you didn't have money to get more. : Never true  Physical Activity: Insufficiently Active (01/12/2022)    Received from [facility], [facility]    Exercise Vital Sign     Days of Exercise per Week: 3 days     Minutes of Exercise per Session: 30 min  Stress: No Stress Concern Present (01/12/2022)    Received from [facility], [facility] - Occupational Stress Questionnaire     Feeling of Stress : Not at all  Social Connections: Socially Integrated (01/12/2022)    Received from [facility], [facility]    Social Network     How would you rate your social network (family, work, friends)?: Good participation with social networks  Housing Stability: Low Risk  (06/10/2022)    Received from [facility]    Living Situation     What is your living situation today?: I have a steady place to live     Think about the place you live. Do you have problems with any of the following? Choose all that apply:: None/None on this list        Objective:         Vitals:    07/13/22 1005  BP: 128/86  Pulse: 98  Temp: 36.8 C (98.3 F)  SpO2: 99%  Weight: (!) 108 kg (238 lb)    Body mass index is 42.16 kg/m.   Physical Exam    Constitutional:  WDWN in NAD, conversant, no obvious deformities; lying in bed comfortably Eyes:  Pupils equal, round; sclera anicteric; moist conjunctiva; no lid lag HENT:  Oral mucosa moist; good dentition  Neck:  No masses palpated, trachea midline; no thyromegaly Lungs:  CTA bilaterally; normal respiratory effort Breasts:  symmetric, no nipple changes; the right breast shows a smooth, firm palpable mass  measuring approximately 2 cm in diameter in the right lower inner quadrant about 4 cm from  the nipple.  No axillary lymphadenopathy on the right. The left breast has some visual asymmetry with a protruding mass in the upper inner quadrant.  On palpation, this corresponds to the mass located at 0 930.  Currently it measures about 5 cm in greatest diameter.  The mass is firm and smooth.  Inferior to this, there is a smaller palpable mass that is about 4 cm.  This is also firm and smooth with no fixation to the overlying skin or the underlying muscle. CV:  Regular rate and rhythm; no murmurs; extremities well-perfused with no edema Abd:  +bowel sounds, soft, non-tender, no palpable organomegaly; no palpable hernias; healed laparoscopic incisions Musc:  Normal gait; no apparent clubbing or cyanosis in extremities Lymphatic:  No palpable cervical or axillary lymphadenopathy Skin:  Warm, dry; no sign of jaundice Psychiatric - alert and oriented x 4; calm mood and affect     Labs, Imaging and Diagnostic Testing: CLINICAL DATA:  Two year follow-up for mass in the 9:30 o'clock location of the LEFT breast. Long-term stability documented for mass in the 7 o'clock location of the LEFT breast previously.   EXAM: ULTRASOUND OF THE LEFT BREAST   COMPARISON:  Previous exam(s).   FINDINGS: Targeted ultrasound is performed, showing a circumscribed oval hypoechoic parallel mass with posterior acoustic enhancement in the 9:30 o'clock location of the LEFT breast 5 centimeters from the nipple which measures 3.0 x 2.4 x 1.9 centimeters. Previously, mass measured 3.0 x 2.5 x 1.9 centimeters.   IMPRESSION: Long-term stability of mass in the 9:30 o'clock location of the LEFT breast, consistent with benign fibroadenoma. Patient is considering breast reduction and has questions about having the fibroadenomas removed.   RECOMMENDATION: Recommend surgical consultation to discuss excision of fibroadenomas.    Recommend screening mammogram at age 82 unless there are persistent or intervening clinical concerns. (Code:SM-B-40A)   I have discussed the findings and recommendations with the patient. If applicable, a reminder letter will be sent to the patient regarding the next appointment.   BI-RADS CATEGORY  2: Benign.   Electronically Signed: By: Norva Pavlov M.D. On: 02/05/2021 13:52     Assessment and Plan:  Diagnoses and all orders for this visit:   Fibroadenoma of breast, left   Fibroadenoma, right     The patient seems to have enlarging fibroadenomas of the left breast.  She has a new palpable mass in the right lower inner quadrant.  Prior to planning a table surgery, would recommend imaging of both breasts to measure the size of the left breast fibroadenomas and also to evaluate the new right breast mass.  If these continue to appear to be fibroadenomas, we will plan excision of bilateral breast fibroadenomas (2 masses on the left and 1 mass on the right).   Return if symptoms worsen or fail to improve.   Alexandrya Chim Delbert Harness, MD  07/13/2022 10:37 AM

## 2022-07-15 ENCOUNTER — Other Ambulatory Visit: Payer: Self-pay | Admitting: Surgery

## 2022-07-15 DIAGNOSIS — D241 Benign neoplasm of right breast: Secondary | ICD-10-CM

## 2022-08-04 ENCOUNTER — Other Ambulatory Visit: Payer: 59

## 2022-08-07 ENCOUNTER — Ambulatory Visit
Admission: RE | Admit: 2022-08-07 | Discharge: 2022-08-07 | Disposition: A | Discharge status: Home / Self Care | Payer: 59 | Source: Ambulatory Visit | Attending: Surgery | Admitting: Surgery

## 2022-08-07 ENCOUNTER — Other Ambulatory Visit: Payer: Self-pay | Admitting: Surgery

## 2022-08-07 DIAGNOSIS — D241 Benign neoplasm of right breast: Secondary | ICD-10-CM

## 2022-08-07 DIAGNOSIS — N6320 Unspecified lump in the left breast, unspecified quadrant: Secondary | ICD-10-CM

## 2022-08-14 ENCOUNTER — Ambulatory Visit
Admission: RE | Admit: 2022-08-14 | Discharge: 2022-08-14 | Disposition: A | Discharge status: Home / Self Care | Payer: 59 | Source: Ambulatory Visit | Attending: Surgery | Admitting: Surgery

## 2022-08-14 DIAGNOSIS — N6320 Unspecified lump in the left breast, unspecified quadrant: Secondary | ICD-10-CM

## 2022-08-14 HISTORY — PX: BREAST BIOPSY: SHX20

## 2022-08-18 ENCOUNTER — Ambulatory Visit: Payer: Self-pay | Admitting: Surgery

## 2022-08-18 DIAGNOSIS — D241 Benign neoplasm of right breast: Secondary | ICD-10-CM

## 2022-08-20 ENCOUNTER — Encounter: Payer: Self-pay | Admitting: Skilled Nursing Facility1

## 2022-08-20 ENCOUNTER — Encounter: Payer: 59 | Attending: General Surgery | Admitting: Skilled Nursing Facility1

## 2022-08-20 VITALS — Wt 239.8 lb

## 2022-08-20 DIAGNOSIS — E669 Obesity, unspecified: Secondary | ICD-10-CM | POA: Insufficient documentation

## 2022-08-20 NOTE — Progress Notes (Signed)
Bariatric Nutrition Follow-Up Visit Medical Nutrition Therapy  Surgery date: 02/23/2022 Surgery type: Sleeve Gastrectomy  Anthropometrics  Start weight at [facility]: 275.8 lbs (date: 01/08/2022)  Height: 63 in Weight today: 239.8 lb   Clinical  Medical hx: HTN, anemia obesity Medications: Amlodipine, losartan HTZ   Labs: Vit D; 16.1; glucose 113; potasium 3.3 Notable signs/symptoms: none noted Any previous deficiencies? No Bowel Habits: Every day to every other day no complaints   Body Composition Scale 03/10/2022 04/22/2022 08/20/2022  Current Body Weight 254.1 249.1 239.8  Total Body Fat % 45.6 45.1 44.2  Visceral Fat 15 15 14   Fat-Free Mass % 54.3 54.8 55.7   Total Body Water % 41.6 41.9 42.3  Muscle-Mass lbs 31.9 31.8 31.8  BMI 44.6 43.7 42.1  Body Fat Displacement            Torso  lbs 71.9 69.7 65.8         Left Leg  lbs 14.3 13.9 13.1         Right Leg  lbs 14.3 13.9 13.1         Left Arm  lbs 7.1 6.9 6.5         Right Arm   lbs 7.1 6.9 6.5     Lifestyle & Dietary Hx  Pt states she realized sense she was not able to eat the whole meal she would cry at the beginning but now has accepted it and is okay with that fact.  Pt states she has been trying out intermittent fasting and protein shakes and going more to the first phases of surgery in an effort to see more weight loss.  Pt states her skin is clearer.   Estimated daily fluid intake: 64 oz Estimated daily protein intake: 60 g Supplements: multi and calcium  Current average weekly physical activity: walking 3 days a week 20-30 minutes, dumbbells, trying to get to 10000 steps in a day   24-Hr Dietary Recall First Meal 7:30-9: protein shake and coffee Snack:   Second Meal 12:30-1: boiled egg Snack:   Third Meal: chicken or patty with cream sauce or [place] mike mini sandwich Snack:  Beverages: water, vitamin water, water + flavoring, half and half sweet tea  Post-Op Goals/ Signs/ Symptoms Using straws:  no Drinking while eating: no Chewing/swallowing difficulties: no Changes in vision: no Changes to mood/headaches: no Hair loss/changes to skin/nails: no Difficulty focusing/concentrating: no Sweating: no Limb weakness: no Dizziness/lightheadedness: no Palpitations: no  Carbonated/caffeinated beverages: no N/V/D/C/Gas: taking mirilax  Abdominal pain: no Dumping syndrome: with panera bread autumn squash soup     NUTRITION DIAGNOSIS  Overweight/obesity ([place]-3.3) related to past poor dietary habits and physical inactivity as evidenced by completed bariatric surgery and following dietary guidelines for continued weight loss and healthy nutrition status.     NUTRITION INTERVENTION Nutrition counseling (C-1) and education (E-2) to facilitate bariatric surgery goals, including:  Dietitian worked with pt on their emotional relationship with food as this is integral to the pts maintenance of a healthy lifestyle long term. Mindful Eating: Encouraged mindfulness during meals. This involves paying attention to the sensory experience of eating, such as the taste, texture, and smell of food. It can help increase awareness of hunger and satisfaction cues. Gave pt mindfulness exercise  Emotional Awareness: Fostered emotional awareness by helping the patient recognize and express their emotions in ways other than turning to food (moving through the emotion rather than stuffing it down with food). Encouraged journaling, talking to a therapist, or engaging  in activities that provide emotional support and assist the pt in more appropriately dealing with their feelings Assist the patient in finding alternative coping strategies for managing emotions without resorting to food. This could include exercise, meditation, deep breathing, or engaging in hobbies.  Support System: Encourage the patient to build a strong support system, whether it's through friends, family, or support groups. Having a network can  provide emotional support during challenging times.  Self-Compassion: Foster self-compassion and encourage the patient to be kind to themselves. Building a positive self-image can contribute to healthier relationships with food. Gradual Changes: Support the patient in making gradual, sustainable changes to their eating habits. Small steps over time are more likely to lead to long-term success. Nutritional Education: Provided education on the nutritional aspects of food and help the patient understand the role of food in nourishing the body rather than solely as a way to cope with emotions.  Handouts Provided Include  6 month handout  Learning Style & Readiness for Change Teaching method utilized: Visual & Auditory  Demonstrated degree of understanding via: Teach Back  Readiness Level: contemplative  Barriers to learning/adherence to lifestyle change: unidentified   RD's Notes for Next Visit Assess adherence to pt chosen goals    MONITORING & EVALUATION Dietary intake, weekly physical activity, body weight  Next Steps Patient is to follow-up in 3 months

## 2022-08-24 ENCOUNTER — Other Ambulatory Visit: Payer: Self-pay | Admitting: Surgery

## 2022-08-24 DIAGNOSIS — D241 Benign neoplasm of right breast: Secondary | ICD-10-CM

## 2022-09-07 ENCOUNTER — Encounter (HOSPITAL_BASED_OUTPATIENT_CLINIC_OR_DEPARTMENT_OTHER): Payer: Self-pay | Admitting: Surgery

## 2022-09-07 ENCOUNTER — Other Ambulatory Visit: Payer: Self-pay

## 2022-09-11 ENCOUNTER — Encounter (HOSPITAL_BASED_OUTPATIENT_CLINIC_OR_DEPARTMENT_OTHER)
Admission: RE | Admit: 2022-09-11 | Discharge: 2022-09-11 | Disposition: A | Discharge status: Home / Self Care | Payer: 59 | Source: Ambulatory Visit | Attending: Surgery | Admitting: Surgery

## 2022-09-11 DIAGNOSIS — Z01812 Encounter for preprocedural laboratory examination: Secondary | ICD-10-CM | POA: Diagnosis present

## 2022-09-11 LAB — BASIC METABOLIC PANEL
Anion gap: 8 (ref 5–15)
BUN: 12 mg/dL (ref 6–20)
CO2: 26 mmol/L (ref 22–32)
Calcium: 9.2 mg/dL (ref 8.9–10.3)
Chloride: 104 mmol/L (ref 98–111)
Creatinine, Ser: 0.97 mg/dL (ref 0.44–1.00)
GFR, Estimated: >60 mL/min (ref >60)
Glucose, Bld: 89 mg/dL (ref 70–99)
Potassium: 4.1 mmol/L (ref 3.5–5.1)
Sodium: 138 mmol/L (ref 135–145)

## 2022-09-11 MED ORDER — CHLORHEXIDINE GLUCONATE CLOTH 2 % EX PADS: 6.0000 | MEDICATED_PAD | Freq: Once | CUTANEOUS | Status: DC

## 2022-09-11 NOTE — Progress Notes (Signed)

## 2022-09-14 ENCOUNTER — Ambulatory Visit
Admission: RE | Admit: 2022-09-14 | Discharge: 2022-09-14 | Disposition: A | Discharge status: Home / Self Care | Payer: 59 | Source: Ambulatory Visit | Attending: Surgery | Admitting: Surgery

## 2022-09-14 DIAGNOSIS — D241 Benign neoplasm of right breast: Secondary | ICD-10-CM

## 2022-09-14 HISTORY — PX: BREAST BIOPSY: SHX20

## 2022-09-14 NOTE — Anesthesia Preprocedure Evaluation (Addendum)
Anesthesia Evaluation  Patient identified by MRN, date of birth, ID band Patient awake    Reviewed: Allergy & Precautions, NPO status , Patient's Chart, lab work & pertinent test results  Airway Mallampati: II  TM Distance: >3 FB Neck ROM: Full    Dental no notable dental hx. (+) Teeth Intact, Dental Advisory Given   Pulmonary neg pulmonary ROS   Pulmonary exam normal breath sounds clear to auscultation       Cardiovascular hypertension, Pt. on medications Normal cardiovascular exam Rhythm:Regular Rate:Normal     Neuro/Psych negative neurological ROS  negative psych ROS   GI/Hepatic Neg liver ROS,,,  Endo/Other  negative endocrine ROS  Morbid obesity  Renal/GU negative Renal ROS  negative genitourinary   Musculoskeletal  (+) Arthritis ,    Abdominal   Peds  Hematology  (+) Blood dyscrasia, Sickle cell trait and anemia   Anesthesia Other Findings   Reproductive/Obstetrics                             Anesthesia Physical Anesthesia Plan  ASA: 3  Anesthesia Plan: General   Post-op Pain Management: Precedex   Induction: Intravenous  PONV Risk Score and Plan: 3 and Ondansetron, Dexamethasone, Midazolam, Treatment may vary due to age or medical condition, TIVA and Propofol infusion  Airway Management Planned: Oral ETT and LMA  Additional Equipment: None  Intra-op Plan:   Post-operative Plan:   Informed Consent: I have reviewed the patients History and Physical, chart, labs and discussed the procedure including the risks, benefits and alternatives for the proposed anesthesia with the patient or authorized representative who has indicated his/her understanding and acceptance.     Dental advisory given  Plan Discussed with: CRNA  Anesthesia Plan Comments:        Anesthesia Quick Evaluation

## 2022-09-15 ENCOUNTER — Encounter (HOSPITAL_BASED_OUTPATIENT_CLINIC_OR_DEPARTMENT_OTHER): Payer: Self-pay | Admitting: Surgery

## 2022-09-15 ENCOUNTER — Other Ambulatory Visit: Payer: Self-pay

## 2022-09-15 ENCOUNTER — Encounter (HOSPITAL_BASED_OUTPATIENT_CLINIC_OR_DEPARTMENT_OTHER)
Admission: RE | Disposition: A | Discharge status: Home / Self Care | Payer: Self-pay | Source: Home / Self Care | Attending: Surgery

## 2022-09-15 ENCOUNTER — Ambulatory Visit (HOSPITAL_BASED_OUTPATIENT_CLINIC_OR_DEPARTMENT_OTHER): Payer: 59 | Admitting: Anesthesiology

## 2022-09-15 ENCOUNTER — Ambulatory Visit
Admission: RE | Admit: 2022-09-15 | Discharge: 2022-09-15 | Disposition: A | Discharge status: Home / Self Care | Payer: 59 | Source: Ambulatory Visit | Attending: Surgery | Admitting: Surgery

## 2022-09-15 ENCOUNTER — Ambulatory Visit (HOSPITAL_BASED_OUTPATIENT_CLINIC_OR_DEPARTMENT_OTHER)
Admission: RE | Admit: 2022-09-15 | Discharge: 2022-09-15 | Disposition: A | Discharge status: Home / Self Care | Payer: 59 | Attending: Surgery | Admitting: Surgery

## 2022-09-15 DIAGNOSIS — D242 Benign neoplasm of left breast: Secondary | ICD-10-CM

## 2022-09-15 DIAGNOSIS — Z9884 Bariatric surgery status: Secondary | ICD-10-CM | POA: Insufficient documentation

## 2022-09-15 DIAGNOSIS — Z01818 Encounter for other preprocedural examination: Secondary | ICD-10-CM

## 2022-09-15 DIAGNOSIS — Z6841 Body Mass Index (BMI) 40.0 and over, adult: Secondary | ICD-10-CM

## 2022-09-15 DIAGNOSIS — I10 Essential (primary) hypertension: Secondary | ICD-10-CM | POA: Diagnosis not present

## 2022-09-15 DIAGNOSIS — D241 Benign neoplasm of right breast: Secondary | ICD-10-CM

## 2022-09-15 DIAGNOSIS — Z79899 Other long term (current) drug therapy: Secondary | ICD-10-CM

## 2022-09-15 DIAGNOSIS — D630 Anemia in neoplastic disease: Secondary | ICD-10-CM | POA: Diagnosis not present

## 2022-09-15 HISTORY — PX: BREAST LUMPECTOMY WITH RADIOACTIVE SEED LOCALIZATION: SHX6424

## 2022-09-15 HISTORY — DX: Gastro-esophageal reflux disease without esophagitis: K21.9

## 2022-09-15 LAB — POCT PREGNANCY, URINE: Preg Test, Ur: NEGATIVE

## 2022-09-15 SURGERY — BREAST LUMPECTOMY WITH RADIOACTIVE SEED LOCALIZATION
Anesthesia: General | Site: Breast | Laterality: Left

## 2022-09-15 MED ORDER — AMISULPRIDE (ANTIEMETIC) 5 MG/2ML IV SOLN: 10.0000 mg | Freq: Once | INTRAVENOUS | Status: DC | PRN

## 2022-09-15 MED ORDER — TRAMADOL HCL 50 MG PO TABS: 50.0000 mg | ORAL_TABLET | Freq: Four times a day (QID) | ORAL | 0 refills | Status: DC | PRN

## 2022-09-15 MED ORDER — LACTATED RINGERS IV SOLN: INTRAVENOUS | Status: DC | PRN

## 2022-09-15 MED ORDER — MIDAZOLAM HCL 2 MG/2ML IJ SOLN
INTRAMUSCULAR | Status: AC
  Filled 2022-09-15: qty 2

## 2022-09-15 MED ORDER — FENTANYL CITRATE (PF) 100 MCG/2ML IJ SOLN
INTRAMUSCULAR | Status: DC | PRN
  Administered 2022-09-15: 25 ug via INTRAVENOUS
  Administered 2022-09-15: 50 ug via INTRAVENOUS
  Administered 2022-09-15: 25 ug via INTRAVENOUS

## 2022-09-15 MED ORDER — OXYCODONE HCL 5 MG/5ML PO SOLN: 5.0000 mg | Freq: Once | ORAL | Status: DC | PRN

## 2022-09-15 MED ORDER — 0.9 % SODIUM CHLORIDE (POUR BTL) OPTIME
TOPICAL | Status: DC | PRN
  Administered 2022-09-15: 200 mL

## 2022-09-15 MED ORDER — ONDANSETRON HCL 4 MG/2ML IJ SOLN
INTRAMUSCULAR | Status: DC | PRN
  Administered 2022-09-15: 4 mg via INTRAVENOUS

## 2022-09-15 MED ORDER — BUPIVACAINE-EPINEPHRINE 0.25% -1:200000 IJ SOLN
INTRAMUSCULAR | Status: DC | PRN
  Administered 2022-09-15: 10 mL

## 2022-09-15 MED ORDER — DEXMEDETOMIDINE HCL IN NACL 200 MCG/50ML IV SOLN
INTRAVENOUS | Status: DC | PRN
  Administered 2022-09-15: 20 ug via INTRAVENOUS

## 2022-09-15 MED ORDER — CEFAZOLIN SODIUM-DEXTROSE 2-3 GM-%(50ML) IV SOLR
INTRAVENOUS | Status: DC | PRN
  Administered 2022-09-15: 2 g via INTRAVENOUS

## 2022-09-15 MED ORDER — LIDOCAINE HCL (CARDIAC) PF 100 MG/5ML IV SOSY
PREFILLED_SYRINGE | INTRAVENOUS | Status: DC | PRN
  Administered 2022-09-15: 100 mg via INTRAVENOUS

## 2022-09-15 MED ORDER — MIDAZOLAM HCL 2 MG/2ML IJ SOLN
INTRAMUSCULAR | Status: DC | PRN
  Administered 2022-09-15: 2 mg via INTRAVENOUS

## 2022-09-15 MED ORDER — ONDANSETRON HCL 4 MG/2ML IJ SOLN: 4.0000 mg | Freq: Once | INTRAMUSCULAR | Status: DC | PRN

## 2022-09-15 MED ORDER — OXYCODONE HCL 5 MG PO TABS: 5.0000 mg | ORAL_TABLET | Freq: Once | ORAL | Status: DC | PRN

## 2022-09-15 MED ORDER — HYDROMORPHONE HCL 1 MG/ML IJ SOLN
INTRAMUSCULAR | Status: AC
  Filled 2022-09-15: qty 0.5

## 2022-09-15 MED ORDER — HYDROMORPHONE HCL 1 MG/ML IJ SOLN
0.2500 mg | INTRAMUSCULAR | Status: DC | PRN
  Administered 2022-09-15: 0.25 mg via INTRAVENOUS

## 2022-09-15 MED ORDER — PROPOFOL 10 MG/ML IV BOLUS
INTRAVENOUS | Status: DC | PRN
  Administered 2022-09-15: 200 mg via INTRAVENOUS

## 2022-09-15 MED ORDER — KETOROLAC TROMETHAMINE 15 MG/ML IJ SOLN
INTRAMUSCULAR | Status: DC | PRN
  Administered 2022-09-15: 15 mg via INTRAVENOUS

## 2022-09-15 MED ORDER — CEFAZOLIN SODIUM-DEXTROSE 2-4 GM/100ML-% IV SOLN
INTRAVENOUS | Status: AC
  Filled 2022-09-15: qty 100

## 2022-09-15 MED ORDER — ACETAMINOPHEN 500 MG PO TABS
ORAL_TABLET | ORAL | Status: AC
  Filled 2022-09-15: qty 2

## 2022-09-15 MED ORDER — LIDOCAINE 2% (20 MG/ML) 5 ML SYRINGE
INTRAMUSCULAR | Status: AC
  Filled 2022-09-15: qty 5

## 2022-09-15 MED ORDER — LACTATED RINGERS IV SOLN: INTRAVENOUS | Status: DC

## 2022-09-15 MED ORDER — PROPOFOL 10 MG/ML IV BOLUS
INTRAVENOUS | Status: AC
  Filled 2022-09-15: qty 20

## 2022-09-15 MED ORDER — ACETAMINOPHEN 10 MG/ML IV SOLN: 1000.0000 mg | Freq: Once | INTRAVENOUS | Status: DC | PRN

## 2022-09-15 MED ORDER — CEFAZOLIN SODIUM-DEXTROSE 2-4 GM/100ML-% IV SOLN: 2.0000 g | INTRAVENOUS | Status: DC

## 2022-09-15 MED ORDER — FENTANYL CITRATE (PF) 100 MCG/2ML IJ SOLN
INTRAMUSCULAR | Status: AC
  Filled 2022-09-15: qty 2

## 2022-09-15 MED ORDER — PROPOFOL 500 MG/50ML IV EMUL
INTRAVENOUS | Status: DC | PRN
  Administered 2022-09-15: 35 ug/kg/min via INTRAVENOUS

## 2022-09-15 MED ORDER — ACETAMINOPHEN 500 MG PO TABS
1000.0000 mg | ORAL_TABLET | ORAL | Status: AC
  Administered 2022-09-15: 1000 mg via ORAL

## 2022-09-15 MED ORDER — EPHEDRINE SULFATE (PRESSORS) 50 MG/ML IJ SOLN
INTRAMUSCULAR | Status: DC | PRN
  Administered 2022-09-15: 10 mg via INTRAVENOUS

## 2022-09-15 SURGICAL SUPPLY — 47 items
APL PRP STRL LF DISP 70% ISPRP (MISCELLANEOUS) ×1
APL SKNCLS NONHYPOALLERGENIC (GAUZE/BANDAGES/DRESSINGS) ×1
APL SKNCLS STERI-STRIP NONHPOA (GAUZE/BANDAGES/DRESSINGS) ×1
APPLIER CLIP 9.375 MED OPEN (MISCELLANEOUS) ×1
APR CLP MED 9.3 20 MLT OPN (MISCELLANEOUS) ×1
BENZOIN TINCTURE PRP APPL 2/3 (GAUZE/BANDAGES/DRESSINGS) ×1 IMPLANT
BLADE HEX COATED 2.75 (ELECTRODE) ×1 IMPLANT
BLADE SURG 15 STRL LF DISP TIS (BLADE) ×1 IMPLANT
BLADE SURG 15 STRL SS (BLADE) ×1
CANISTER SUC SOCK COL 7IN (MISCELLANEOUS) IMPLANT
CANISTER SUCT 1200ML W/VALVE (MISCELLANEOUS) IMPLANT
CHLORAPREP W/TINT 26 (MISCELLANEOUS) ×1 IMPLANT
CLIP APPLIE 9.375 MED OPEN (MISCELLANEOUS) ×1 IMPLANT
COVER BACK TABLE 60X90IN (DRAPES) ×1 IMPLANT
COVER MAYO STAND STRL (DRAPES) ×1 IMPLANT
COVER PROBE CYLINDRICAL 5X96 (MISCELLANEOUS) ×1 IMPLANT
DRAPE LAPAROTOMY 100X72 PEDS (DRAPES) ×1 IMPLANT
DRAPE UTILITY XL STRL (DRAPES) ×1 IMPLANT
DRSG TEGADERM 4X4.75 (GAUZE/BANDAGES/DRESSINGS) ×1 IMPLANT
ELECT REM PT RETURN 9FT ADLT (ELECTROSURGICAL) ×1
ELECTRODE REM PT RTRN 9FT ADLT (ELECTROSURGICAL) ×1 IMPLANT
GAUZE SPONGE 2X2 STRL 8-PLY (GAUZE/BANDAGES/DRESSINGS) IMPLANT
GAUZE SPONGE 4X4 12PLY STRL LF (GAUZE/BANDAGES/DRESSINGS) IMPLANT
GLOVE BIO SURGEON STRL SZ7 (GLOVE) ×1 IMPLANT
GLOVE BIOGEL PI IND STRL 7.5 (GLOVE) ×1 IMPLANT
GOWN STRL REUS W/ TWL LRG LVL3 (GOWN DISPOSABLE) ×2 IMPLANT
GOWN STRL REUS W/TWL LRG LVL3 (GOWN DISPOSABLE) ×2
KIT MARKER MARGIN INK (KITS) ×1 IMPLANT
NDL HYPO 25X1 1.5 SAFETY (NEEDLE) ×1 IMPLANT
NEEDLE HYPO 25X1 1.5 SAFETY (NEEDLE) ×1 IMPLANT
NS IRRIG 1000ML POUR BTL (IV SOLUTION) ×1 IMPLANT
PACK BASIN DAY SURGERY FS (CUSTOM PROCEDURE TRAY) ×1 IMPLANT
PENCIL SMOKE EVACUATOR (MISCELLANEOUS) ×1 IMPLANT
SLEEVE SCD COMPRESS KNEE MED (STOCKING) ×1 IMPLANT
SPIKE FLUID TRANSFER (MISCELLANEOUS) IMPLANT
SPONGE T-LAP 4X18 ~~LOC~~+RFID (SPONGE) ×1 IMPLANT
STRIP CLOSURE SKIN 1/2X4 (GAUZE/BANDAGES/DRESSINGS) ×1 IMPLANT
SUT MON AB 4-0 PC3 18 (SUTURE) ×1 IMPLANT
SUT SILK 2 0 SH (SUTURE) IMPLANT
SUT VIC AB 3-0 SH 27 (SUTURE) ×1
SUT VIC AB 3-0 SH 27X BRD (SUTURE) ×1 IMPLANT
SYR BULB EAR ULCER 3OZ GRN STR (SYRINGE) IMPLANT
SYR CONTROL 10ML LL (SYRINGE) ×1 IMPLANT
TOWEL GREEN STERILE FF (TOWEL DISPOSABLE) ×1 IMPLANT
TRAY FAXITRON CT DISP (TRAY / TRAY PROCEDURE) ×1 IMPLANT
TUBE CONNECTING 20X1/4 (TUBING) IMPLANT
YANKAUER SUCT BULB TIP NO VENT (SUCTIONS) IMPLANT

## 2022-09-15 NOTE — Discharge Instructions (Addendum)
[facility] [employer] [facility] Office Phone Number [phone]  BREAST BIOPSY/ PARTIAL MASTECTOMY: POST OP INSTRUCTIONS  Always review your discharge instruction sheet given to you by the facility where your surgery was performed.  IF YOU HAVE DISABILITY OR FAMILY LEAVE FORMS, YOU MUST BRING THEM TO THE OFFICE FOR PROCESSING.  DO NOT GIVE THEM TO YOUR DOCTOR.  A prescription for pain medication may be given to you upon discharge.  Take your pain medication as prescribed, if needed.  If narcotic pain medicine is not needed, then you may take acetaminophen (Tylenol) or ibuprofen (Advil) as needed. Take your usually prescribed medications unless otherwise directed If you need a refill on your pain medication, please contact your pharmacy.  They will contact our office to request authorization.  Prescriptions will not be filled after 5pm or on week-ends. You should eat very light the first 24 hours after surgery, such as soup, crackers, pudding, etc.  Resume your normal diet the day after surgery. Most patients will experience some swelling and bruising in the breast.  Ice packs and a good support bra will help.  Swelling and bruising can take several days to resolve.  It is common to experience some constipation if taking pain medication after surgery.  Increasing fluid intake and taking a stool softener will usually help or prevent this problem from occurring.  A mild laxative (Milk of Magnesia or Miralax) should be taken according to package directions if there are no bowel movements after 48 hours. Unless discharge instructions indicate otherwise, you may remove your bandages 48 hours after surgery, and you may shower at that time.  You will have steri-strips (small skin tapes) in place directly over the incision.  These strips should be left on the skin for 7-10 days.   Any sutures or staples will be removed at the office during your follow-up visit. ACTIVITIES:  You may resume regular daily  activities (gradually increasing) beginning the next day.  Wearing a good support bra or sports bra minimizes pain and swelling.  You may have sexual intercourse when it is comfortable. You may drive when you no longer are taking prescription pain medication, you can comfortably wear a seatbelt, and you can safely maneuver your car and apply brakes. RETURN TO WORK:  1-2 weeks You should see your doctor in the office for a follow-up appointment approximately two weeks after your surgery.  Your doctor's nurse will typically make your follow-up appointment when she calls you with your pathology report.  Expect your pathology report 2-3 business days after your surgery.  You may call to check if you do not hear from [place] after three days. OTHER INSTRUCTIONS: _______________________________________________________________________________________________ _____________________________________________________________________________________________________________________________________ _____________________________________________________________________________________________________________________________________ _____________________________________________________________________________________________________________________________________  WHEN TO CALL YOUR DOCTOR: Fever over 101.0 Nausea and/or vomiting. Extreme swelling or bruising. Continued bleeding from incision. Increased pain, redness, or drainage from the incision.  The clinic staff is available to answer your questions during regular business hours.  Please don't hesitate to call and ask to speak to one of the nurses for clinical concerns.  If you have a medical emergency, go to the nearest emergency room or call 911.  A surgeon from [facility] is always on call at the hospital.  For further questions, please visit [email or web address]   Next dose of Tylenol can be taken at 230pm today if needed. Next dose of  Ibuprofen can be taken at 4pm today if needed.

## 2022-09-15 NOTE — Transfer of Care (Signed)
Immediate Anesthesia Transfer of Care Note  Patient: Madeline Cooke  Procedure(s) Performed: LEFT BREAST LUMPECTOMY WITH RADIOACTIVE SEED LOCALIZATION X2 (Left: Breast)  Patient Location: PACU  Anesthesia Type:General  Level of Consciousness: awake, alert , and oriented  Airway & Oxygen Therapy: Patient Spontanous Breathing and Patient connected to face mask oxygen  Post-op Assessment: Report given to RN and Post -op Vital signs reviewed and stable  Post vital signs: Reviewed and stable  Last Vitals:  Vitals Value Taken Time  BP    Temp    Pulse 78 09/15/22 0959  Resp 10 09/15/22 0959  SpO2 98 % 09/15/22 0959  Vitals shown include unvalidated device data.  Last Pain:  Vitals:   09/15/22 0732  TempSrc: Oral  PainSc: 0-No pain      Patients Stated Pain Goal: 5 (09/15/22 0732)  Complications: No notable events documented.

## 2022-09-15 NOTE — Op Note (Signed)
Pre-op Diagnosis:  Multiple left breast fibroadenomas Post-op Diagnosis: same Procedure:  Left breast radioactive seed localized lumpectomy  x two Surgeon:  Kynadee Dam K. Anesthesia:  GEN - LMA Indications:  This patient presented with several palpable left breast masses.  Ultrasound showed several fibroadenomas.  She presents now for excision of the two largest fibroadenomas - one at 1:00 and the other at 7:00.  Description of procedure: The patient is brought to the operating room placed in supine position on the operating room table. After an adequate level of general anesthesia was obtained, her left breast was prepped with ChloraPrep and draped in sterile fashion. A timeout was taken to ensure the proper patient and proper procedure. We interrogated the breast with the neoprobe. We made a circumareolar incision around the upper and medial side of the nipple after infiltrating with 0.25% Marcaine. We began with the mass at 1:00.  Dissection was carried down in the breast tissue with cautery. We used the neoprobe to guide [place] towards the radioactive seed. We excised the entire fibroadenoma around the radioactive seed. The specimen was removed and was oriented with a paint kit. Specimen mammogram showed the radioactive seed as well as the biopsy clip within the specimen. This was sent for pathologic examination. We turned our attention to the 7:00 region.  We dissected towards that mass under neoprobe guidance.  The entire fibroadenoma was excised.  Specimen mammogram also revealed the seed as well as the biopsy clip. There is no residual radioactivity within the biopsy cavity. We inspected carefully for hemostasis. The wound was thoroughly irrigated. The wound was closed with a deep layer of 3-0 Vicryl and a subcuticular layer of 4-0 Monocryl. Benzoin Steri-Strips were applied. The patient was then extubated and brought to the recovery room in stable condition. All sponge, instrument, and needle counts  are correct.  Wilmon Arms. Corliss Skains, MD, [facility]  General/ Trauma Surgery  09/15/2022 9:52 AM

## 2022-09-15 NOTE — Anesthesia Postprocedure Evaluation (Signed)
Anesthesia Post Note  Patient: Madeline Cooke  Procedure(s) Performed: LEFT BREAST LUMPECTOMY WITH RADIOACTIVE SEED LOCALIZATION X2 (Left: Breast)     Patient location during evaluation: PACU Anesthesia Type: General Level of consciousness: awake and alert Pain management: pain level controlled Vital Signs Assessment: post-procedure vital signs reviewed and stable Respiratory status: spontaneous breathing, nonlabored ventilation, respiratory function stable and patient connected to nasal cannula oxygen Cardiovascular status: blood pressure returned to baseline and stable Postop Assessment: no apparent nausea or vomiting Anesthetic complications: no   No notable events documented.  Last Vitals:  Vitals:   09/15/22 1030 09/15/22 1045  BP:  114/84  Pulse: 61 62  Resp: 10 (!) 9  Temp:    SpO2: 97% 94%    Last Pain:  Vitals:   09/15/22 1045  TempSrc:   PainSc: 2                  [name]

## 2022-09-15 NOTE — H&P (Signed)
Chief Complaint: New Consultation       History of Present Illness: Madeline Cooke is a 37 y.o. female who is seen today as an office consultation at the request of Dr. Cherly Hensen for evaluation of New Consultation .     This is a 37 year old female status post laparoscopic gastric sleeve by Dr. Andrey Campanile in 2023.  Over the last several years, the patient has been followed with occasional breast ultrasounds for palpable masses in the left breast.  Her last ultrasound was 02/05/2021.  At that time, she was noted to have a mass located at 930 in the left breast 5 cm from the nipple measuring 3.0 x 2.4 x 1.9 cm.  She has another left breast mass located at 7:00, 1 cm from the nipple measuring 4.0 x 1.7 x 1.3 cm.  At that time, she was referred for surgical evaluation but chose to not keep that appointment.  She feels that the mass located at 930 has become larger and is causing some discomfort.  She has not had any further imaging.  She denies palpating any masses in the right breast.  No family history of breast cancer.  No nipple discharge.   Review of Systems: A complete review of systems was obtained from the patient.  I have reviewed this information and discussed as appropriate with the patient.  See HPI as well for other ROS.   Review of Systems  Constitutional: Negative.   HENT: Negative.    Eyes: Negative.   Respiratory: Negative.    Cardiovascular: Negative.   Gastrointestinal: Negative.   Genitourinary: Negative.   Musculoskeletal: Negative.   Skin: Negative.   Neurological: Negative.   Endo/Heme/Allergies: Negative.   Psychiatric/Behavioral: Negative.          Medical History: Past Medical History         Past Medical History:  Diagnosis Date   Anemia     Diabetes mellitus without complication (CMS/HHS-HCC)     Hypertension          Problem List       Patient Active Problem List  Diagnosis   Essential hypertension        Past Surgical History           Past  Surgical History:  Procedure Laterality Date   LAPAROSCOPIC SLEEVE GASTRECTOMY   02/23/2022    Dr Andrey Campanile   REPAIR HIATAL HERNIA   02/23/2022    Dr Andrey Campanile   mole removed             Allergies  No Known Allergies      Medications Ordered Prior to Encounter             Current Outpatient Medications on File Prior to Visit  Medication Sig Dispense Refill   amLODIPine (NORVASC) 10 MG tablet Take 1 tablet by mouth once daily       hydrALAZINE (APRESOLINE) 10 MG tablet Take 10 mg by mouth 3 (three) times daily       iron polysaccharides (FERREX) 150 mg iron capsule Take 150 mg by mouth once daily       losartan-hydroCHLOROthiazide (HYZAAR) 100-12.5 mg tablet         omeprazole (PRILOSEC) 40 MG DR capsule Take 1 capsule (40 mg total) by mouth once daily 90 capsule 0   pantoprazole (PROTONIX) 40 MG DR tablet Take 1 tablet (40 mg total) by mouth once daily 90 tablet 3   spironolactone (ALDACTONE) 50 MG tablet Take 50 mg by mouth  once daily       valsartan-hydroCHLOROthiazide (DIOVAN-HCT) 320-25 mg tablet Take 1 tablet by mouth once daily       ergocalciferol, vitamin D2, 1,250 mcg (50,000 unit) capsule Take 1 capsule (50,000 Units total) by mouth once a week for 30 days 4 capsule 0    No current facility-administered medications on file prior to visit.        Family History  History reviewed. No pertinent family history.      Tobacco Use History  Social History         Tobacco Use  Smoking Status Never  Smokeless Tobacco Never        Social History  Social History           Socioeconomic History   Marital status: Married  Tobacco Use   Smoking status: Never   Smokeless tobacco: Never  Substance and Sexual Activity   Alcohol use: Not Currently   Drug use: Never    Social Determinants of Health           Financial Resource Strain: Low Risk  (01/12/2022)    Received from [facility], [facility]    Overall Financial Resource Strain (CARDIA)     Difficulty of  Paying Living Expenses: Not hard at all  Food Insecurity: Low Risk  (06/10/2022)    Received from [facility]    Food vital sign     Within the past 12 months, you worried that your food would run out before you got money to buy more: Never true     Within the past 12 months, the food you bought just didn't last and you didn't have money to get more. : Never true  Physical Activity: Insufficiently Active (01/12/2022)    Received from [facility], [facility]    Exercise Vital Sign     Days of Exercise per Week: 3 days     Minutes of Exercise per Session: 30 min  Stress: No Stress Concern Present (01/12/2022)    Received from [facility], [facility] - Occupational Stress Questionnaire     Feeling of Stress : Not at all  Social Connections: Socially Integrated (01/12/2022)    Received from [facility], [facility]    Social Network     How would you rate your social network (family, work, friends)?: Good participation with social networks  Housing Stability: Low Risk  (06/10/2022)    Received from [facility]    Living Situation     What is your living situation today?: I have a steady place to live     Think about the place you live. Do you have problems with any of the following? Choose all that apply:: None/None on this list        Objective:           Vitals:    07/13/22 1005  BP: 128/86  Pulse: 98  Temp: 36.8 C (98.3 F)  SpO2: 99%  Weight: (!) 108 kg (238 lb)    Body mass index is 42.16 kg/m.   Physical Exam    Constitutional:  WDWN in NAD, conversant, no obvious deformities; lying in bed comfortably Eyes:  Pupils equal, round; sclera anicteric; moist conjunctiva; no lid lag HENT:  Oral mucosa moist; good dentition  Neck:  No masses palpated, trachea midline; no thyromegaly Lungs:  CTA bilaterally; normal respiratory effort Breasts:  symmetric, no nipple changes; the right breast shows a  smooth, firm  palpable mass measuring approximately 2 cm in diameter in the right lower inner quadrant about 4 cm from the nipple.  No axillary lymphadenopathy on the right. The left breast has some visual asymmetry with a protruding mass in the upper inner quadrant.  On palpation, this corresponds to the mass located at 0 930.  Currently it measures about 5 cm in greatest diameter.  The mass is firm and smooth.  Inferior to this, there is a smaller palpable mass that is about 4 cm.  This is also firm and smooth with no fixation to the overlying skin or the underlying muscle. CV:  Regular rate and rhythm; no murmurs; extremities well-perfused with no edema Abd:  +bowel sounds, soft, non-tender, no palpable organomegaly; no palpable hernias; healed laparoscopic incisions Musc:  Normal gait; no apparent clubbing or cyanosis in extremities Lymphatic:  No palpable cervical or axillary lymphadenopathy Skin:  Warm, dry; no sign of jaundice Psychiatric - alert and oriented x 4; calm mood and affect     Labs, Imaging and Diagnostic Testing: CLINICAL DATA:  Two year follow-up for mass in the 9:30 o'clock location of the LEFT breast. Long-term stability documented for mass in the 7 o'clock location of the LEFT breast previously.   EXAM: ULTRASOUND OF THE LEFT BREAST   COMPARISON:  Previous exam(s).   FINDINGS: Targeted ultrasound is performed, showing a circumscribed oval hypoechoic parallel mass with posterior acoustic enhancement in the 9:30 o'clock location of the LEFT breast 5 centimeters from the nipple which measures 3.0 x 2.4 x 1.9 centimeters. Previously, mass measured 3.0 x 2.5 x 1.9 centimeters.   IMPRESSION: Long-term stability of mass in the 9:30 o'clock location of the LEFT breast, consistent with benign fibroadenoma. Patient is considering breast reduction and has questions about having the fibroadenomas removed.   RECOMMENDATION: Recommend surgical consultation to discuss excision  of fibroadenomas.   Recommend screening mammogram at age 30 unless there are persistent or intervening clinical concerns. (Code:SM-B-40A)   I have discussed the findings and recommendations with the patient. If applicable, a reminder letter will be sent to the patient regarding the next appointment.   BI-RADS CATEGORY  2: Benign.   Electronically Signed: By: Norva Pavlov M.D. On: 02/05/2021 13:52     Assessment and Plan:  Diagnoses and all orders for this visit:   Fibroadenoma of breast, left x 2        Excision of left breast fibroadenomas x 2.  The surgical procedure has been discussed with the patient.  Potential risks, benefits, alternative treatments, and expected outcomes have been explained.  All of the patient's questions at this time have been answered.  The likelihood of reaching the patient's treatment goal is good.  The patient understand the proposed surgical procedure and wishes to proceed. Wilmon Arms. Corliss Skains, MD, [facility]  General Surgery   09/15/2022 8:28 AM

## 2022-09-16 ENCOUNTER — Encounter (HOSPITAL_BASED_OUTPATIENT_CLINIC_OR_DEPARTMENT_OTHER): Payer: Self-pay | Admitting: Surgery

## 2022-09-17 LAB — SURGICAL PATHOLOGY

## 2022-11-24 ENCOUNTER — Ambulatory Visit: Payer: 59 | Admitting: Skilled Nursing Facility1

## 2022-12-31 ENCOUNTER — Other Ambulatory Visit (HOSPITAL_BASED_OUTPATIENT_CLINIC_OR_DEPARTMENT_OTHER): Payer: Self-pay

## 2022-12-31 ENCOUNTER — Encounter (HOSPITAL_BASED_OUTPATIENT_CLINIC_OR_DEPARTMENT_OTHER): Payer: Self-pay

## 2023-08-04 ENCOUNTER — Ambulatory Visit
Admission: EM | Admit: 2023-08-04 | Discharge: 2023-08-04 | Disposition: A | Discharge status: Home / Self Care | Attending: Family Medicine | Admitting: Family Medicine

## 2023-08-04 ENCOUNTER — Other Ambulatory Visit: Payer: Self-pay

## 2023-08-04 DIAGNOSIS — R6889 Other general symptoms and signs: Secondary | ICD-10-CM

## 2023-08-04 LAB — POC SARS CORONAVIRUS 2 AG -  ED: SARS Coronavirus 2 Ag: NEGATIVE

## 2023-08-04 LAB — POCT INFLUENZA A/B
Influenza A, POC: NEGATIVE
Influenza B, POC: NEGATIVE

## 2023-08-04 LAB — POCT RAPID STREP A (OFFICE): Rapid Strep A Screen: NEGATIVE

## 2023-08-04 MED ORDER — PREDNISONE 20 MG PO TABS
40.0000 mg | ORAL_TABLET | Freq: Once | ORAL | Status: AC
  Administered 2023-08-04: 40 mg via ORAL

## 2023-08-04 NOTE — Discharge Instructions (Signed)
 Drink lots of fluids Take Tylenol  ibuprofen  for pain and fever May take over-the-counter cold medicines as needed This is a virus.  It may take several days to a week to completely resolve

## 2023-08-04 NOTE — ED Provider Notes (Signed)
 [facility]    CSN: [id number] Arrival date & time: 08/04/23  0902      History   Chief Complaint Chief Complaint  Patient presents with   Sore Throat    HPI Madeline Cooke is a 38 y.o. female.   Patient has sore throat, fever and chills, body aches and fatigue.  She states she feels like she has "the flu".  No known exposure to illness.  Would like strep flu and COVID testing.    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   Essential hypertension 06/16/2019   Morbid obesity (HCC) 06/16/2019   Maternal anemia, with delivery 12/28/2018   Furuncle of buttock 12/28/2018   Skin lesion 12/27/2018   Postpartum care following vaginal delivery (10/6) 12/27/2018   Encounter for induction of labor 12/26/2018    Past Surgical History:  Procedure Laterality Date   BREAST BIOPSY Left 08/14/2022   US  LT BREAST BX W LOC DEV EA ADD LESION IMG BX SPEC US  GUIDE 08/14/2022 [facility]   BREAST BIOPSY Left 08/14/2022   US  LT BREAST BX W LOC DEV 1ST LESION IMG BX SPEC US  GUIDE 08/14/2022 [facility]   BREAST BIOPSY  09/14/2022   MM LT RADIOACTIVE SEED LOC MAMMO GUIDE 09/14/2022 [facility]   BREAST BIOPSY  09/14/2022   MM LT RADIOACTIVE SEED EA ADD LESION LOC MAMMO GUIDE 09/14/2022 [facility]   BREAST LUMPECTOMY WITH RADIOACTIVE SEED LOCALIZATION Left 09/15/2022   Procedure: LEFT BREAST LUMPECTOMY WITH RADIOACTIVE SEED LOCALIZATION X2;  Surgeon: Dareen Ebbing, MD;  Location:  SURGERY CENTER;  Service: General;  Laterality: Left;   LAPAROSCOPIC  GASTRIC SLEEVE RESECTION N/A 02/23/2022   Procedure: LAPAROSCOPIC SLEEVE GASTRECTOMY AND HIATAL HERNIA REPAIR;  Surgeon: Aldean Hummingbird, MD;  Location: [facility];  Service: General;  Laterality: N/A;   mole removed     UPPER GI ENDOSCOPY N/A 02/23/2022   Procedure: UPPER GI ENDOSCOPY;  Surgeon: Aldean Hummingbird, MD;  Location: [facility];  Service: General;  Laterality: N/A;   WISDOM TOOTH EXTRACTION      OB History     Gravida  6   Para  5   Term  5   Preterm      AB  1   Living  5      SAB      IAB  1   Ectopic      Multiple  0   Live Births  5            Home Medications    Prior to Admission medications   Medication Sig Start Date End Date Taking? Authorizing Provider  amLODipine  (NORVASC ) 10 MG tablet Take 10 mg by mouth daily.    [provider]  losartan -hydrochlorothiazide  (HYZAAR) 100-25 MG tablet Take 1 tablet by mouth daily.    [provider]  spironolactone  (ALDACTONE ) 25 MG tablet Take 25 mg by mouth daily.    [provider]    Family History Family History  Problem Relation Age of Onset  Healthy Mother    Alcoholism Father    Breast cancer Paternal Aunt    Hypertension Maternal Grandfather    Diabetes Maternal Grandfather    Hypertension Paternal Grandmother    Diabetes Paternal Grandmother     Social History Social History   Tobacco Use   Smoking status: Never   Smokeless tobacco: Never  Vaping Use   Vaping status: Never Used  Substance Use Topics   Alcohol use: Not Currently    Comment: occasionally   Drug use: No     Allergies   Patient has no known allergies.   Review of Systems Review of Systems See HPI  Physical Exam Triage Vital Signs ED Triage Vitals  Encounter Vitals Group     BP 08/04/23 0953 (!) 134/97     Systolic BP Percentile --      Diastolic BP Percentile --      Pulse Rate 08/04/23 0953 98     Resp 08/04/23 0953 16     Temp 08/04/23 0953 99.9 F (37.7 C)     Temp src --       SpO2 --      Weight --      Height --      Head Circumference --      Peak Flow --      Pain Score 08/04/23 0956 8     Pain Loc --      Pain Education --      Exclude from Growth Chart --    No data found.  Updated Vital Signs BP (!) 134/97   Pulse 98   Temp 99.9 F (37.7 C)   Resp 16   Physical Exam Constitutional:      General: She is not in acute distress.    Appearance: She is well-developed and normal weight. She is ill-appearing.     Comments: Shaking chills  HENT:     Head: Normocephalic and atraumatic.     Right Ear: Tympanic membrane normal.     Left Ear: Tympanic membrane normal.     Nose: No congestion.     Mouth/Throat:     Mouth: Mucous membranes are moist.     Pharynx: Posterior oropharyngeal erythema present.     Comments: Posterior pharynx has erythema and multiple small blisters Eyes:     Conjunctiva/sclera: Conjunctivae normal.     Pupils: Pupils are equal, round, and reactive to light.  Cardiovascular:     Rate and Rhythm: Normal rate and regular rhythm.     Heart sounds: Normal heart sounds.  Pulmonary:     Effort: Pulmonary effort is normal. No respiratory distress.  Abdominal:     General: There is no distension.     Palpations: Abdomen is soft.  Musculoskeletal:        General: Normal range of motion.     Cervical back: Normal range of motion.  Lymphadenopathy:     Cervical: No cervical adenopathy.  Skin:    General: Skin is warm and dry.  Neurological:     Mental Status: She is alert.      UC Treatments / Results  Labs (all labs ordered are listed, but only abnormal results are displayed) Labs Reviewed  POCT RAPID STREP A (OFFICE)  POCT INFLUENZA A/B  POC SARS CORONAVIRUS 2 AG -  ED    EKG   Radiology No results found.  Procedures Procedures (including critical care time)  Medications Ordered in UC Medications  predniSONE (DELTASONE) tablet 40 mg (  40 mg Oral Given 08/04/23 1042)    Initial Impression / Assessment  and Plan / UC Course  I have reviewed the triage vital signs and the nursing notes.  Pertinent labs & imaging results that were available during my care of the patient were reviewed by me and considered in my medical decision making (see chart for details).     Explained that her illness is a viral illness, "flulike".  Will give a dose of prednisone to help reduce her throat pain from the blisters.  Symptomatic care discussed Final Clinical Impressions(s) / UC Diagnoses   Final diagnoses:  Flu-like symptoms   Discharge Instructions      Drink lots of fluids Take Tylenol  ibuprofen  for pain and fever May take over-the-counter cold medicines as needed This is a virus.  It may take several days to a week to completely resolve  ED Prescriptions   None    PDMP not reviewed this encounter.   Stephany Ehrich, MD 08/04/23 [id number]

## 2023-08-04 NOTE — ED Triage Notes (Addendum)
 Since yesterday afternoon has had sore throat. Later had body aches, pain, headache. Has tried coricidin and theraflu. Has not noticed fever per patient. Needs paper prescription.
